# Patient Record
Sex: Female | Born: 1995
Health system: Southern US, Community
[De-identification: ages and names within clinical notes are randomized; demographics above are authoritative.]

## PROBLEM LIST (undated history)

## (undated) DIAGNOSIS — R011 Cardiac murmur, unspecified: Secondary | ICD-10-CM

## (undated) DIAGNOSIS — G40109 Localization-related (focal) (partial) symptomatic epilepsy and epileptic syndromes with simple partial seizures, not intractable, without status epilepticus: Secondary | ICD-10-CM

## (undated) DIAGNOSIS — R569 Unspecified convulsions: Secondary | ICD-10-CM

## (undated) DIAGNOSIS — F32A Depression, unspecified: Secondary | ICD-10-CM

## (undated) DIAGNOSIS — Z8041 Family history of malignant neoplasm of ovary: Secondary | ICD-10-CM

## (undated) DIAGNOSIS — F419 Anxiety disorder, unspecified: Secondary | ICD-10-CM

## (undated) DIAGNOSIS — F4310 Post-traumatic stress disorder, unspecified: Secondary | ICD-10-CM

## (undated) DIAGNOSIS — K219 Gastro-esophageal reflux disease without esophagitis: Secondary | ICD-10-CM

## (undated) DIAGNOSIS — Z803 Family history of malignant neoplasm of breast: Secondary | ICD-10-CM

## (undated) DIAGNOSIS — F1911 Other psychoactive substance abuse, in remission: Secondary | ICD-10-CM

## (undated) DIAGNOSIS — K589 Irritable bowel syndrome without diarrhea: Secondary | ICD-10-CM

## (undated) DIAGNOSIS — D649 Anemia, unspecified: Secondary | ICD-10-CM

## (undated) DIAGNOSIS — F329 Major depressive disorder, single episode, unspecified: Secondary | ICD-10-CM

## (undated) DIAGNOSIS — F319 Bipolar disorder, unspecified: Secondary | ICD-10-CM

## (undated) DIAGNOSIS — F410 Panic disorder [episodic paroxysmal anxiety] without agoraphobia: Secondary | ICD-10-CM

## (undated) DIAGNOSIS — N809 Endometriosis, unspecified: Secondary | ICD-10-CM

## (undated) DIAGNOSIS — F603 Borderline personality disorder: Secondary | ICD-10-CM

## (undated) DIAGNOSIS — J302 Other seasonal allergic rhinitis: Secondary | ICD-10-CM

## (undated) HISTORY — DX: Bipolar disorder, unspecified: F31.9

## (undated) HISTORY — DX: Borderline personality disorder: F60.3

## (undated) HISTORY — PX: LAPAROSCOPIC ABDOMINAL EXPLORATION: SHX6249

## (undated) HISTORY — DX: Unspecified convulsions: R56.9

## (undated) HISTORY — PX: TUBAL LIGATION: SHX77

## (undated) HISTORY — DX: Post-traumatic stress disorder, unspecified: F43.10

## (undated) HISTORY — DX: Family history of malignant neoplasm of ovary: Z80.41

## (undated) HISTORY — DX: Panic disorder (episodic paroxysmal anxiety): F41.0

## (undated) HISTORY — PX: WISDOM TOOTH EXTRACTION: SHX21

## (undated) HISTORY — PX: KNEE SURGERY: SHX244

## (undated) HISTORY — DX: Family history of malignant neoplasm of breast: Z80.3

## (undated) HISTORY — PX: LIVER BIOPSY: SHX301

## (undated) HISTORY — DX: Other psychoactive substance abuse, in remission: F19.11

## (undated) HISTORY — PX: EYE SURGERY: SHX253

## (undated) HISTORY — DX: Cardiac murmur, unspecified: R01.1

## (undated) HISTORY — DX: Gastro-esophageal reflux disease without esophagitis: K21.9

## (undated) HISTORY — PX: ABDOMINAL HYSTERECTOMY: SHX81

## (undated) HISTORY — PX: OVARIAN CYST REMOVAL: SHX89

## (undated) HISTORY — PX: TONSILLECTOMY: SUR1361

## (undated) HISTORY — DX: Other seasonal allergic rhinitis: J30.2

---

## 2015-09-27 ENCOUNTER — Emergency Department (HOSPITAL_COMMUNITY)
Admission: EM | Admit: 2015-09-27 | Discharge: 2015-09-27 | Disposition: A | Discharge status: Home / Self Care | Payer: Medicaid Other | Attending: Emergency Medicine | Admitting: Emergency Medicine

## 2015-09-27 ENCOUNTER — Emergency Department (HOSPITAL_COMMUNITY): Admission: EM | Admit: 2015-09-27 | Discharge: 2015-09-27 | Discharge status: Absent without leave | Payer: Self-pay

## 2015-09-27 ENCOUNTER — Encounter (HOSPITAL_COMMUNITY): Payer: Self-pay

## 2015-09-27 DIAGNOSIS — R05 Cough: Secondary | ICD-10-CM | POA: Diagnosis not present

## 2015-09-27 DIAGNOSIS — Z880 Allergy status to penicillin: Secondary | ICD-10-CM | POA: Diagnosis not present

## 2015-09-27 DIAGNOSIS — Z87891 Personal history of nicotine dependence: Secondary | ICD-10-CM | POA: Diagnosis not present

## 2015-09-27 DIAGNOSIS — R0981 Nasal congestion: Secondary | ICD-10-CM | POA: Diagnosis present

## 2015-09-27 DIAGNOSIS — Z9889 Other specified postprocedural states: Secondary | ICD-10-CM | POA: Diagnosis not present

## 2015-09-27 DIAGNOSIS — J0100 Acute maxillary sinusitis, unspecified: Secondary | ICD-10-CM | POA: Diagnosis not present

## 2015-09-27 MED ORDER — FLUTICASONE PROPIONATE 50 MCG/ACT NA SUSP: 2.0000 | Freq: Every day | NASAL | Status: DC

## 2015-09-27 MED ORDER — AZITHROMYCIN 250 MG PO TABS: 250.0000 mg | ORAL_TABLET | Freq: Every day | ORAL | Status: DC

## 2015-09-27 NOTE — ED Notes (Signed)
Patient states she has sinus congestion x 2 weeks. Patient states she has been taking Benadryl, Mucinex, Tylenol sinus and cold, and Nyquil with no relief.

## 2015-09-27 NOTE — Discharge Instructions (Signed)
1. Medications: flonase, mucinex, azithromycin, usual home medications 2. Treatment: rest, drink plenty of fluids, take tylenol or ibuprofen for fever control 3. Follow Up: Please followup with your primary doctor in 3 days for discussion of your diagnoses and further evaluation after today's visit; if you do not have a primary care doctor use the resource guide provided to find one; Return to the ER for high fevers, difficulty breathing or other concerning symptoms    Sinus Rinse WHAT IS A SINUS RINSE? A sinus rinse is a simple home treatment that is used to rinse your sinuses with a sterile mixture of salt and water (saline solution). Sinuses are air-filled spaces in your skull behind the bones of your face and forehead that open into your nasal cavity. You will use the following:  Saline solution.  Neti pot or spray bottle. This releases the saline solution into your nose and through your sinuses. Neti pots and spray bottles can be purchased at [employer], a health food store, or online. WHEN WOULD I DO A SINUS RINSE? A sinus rinse can help to clear mucus, dirt, dust, or pollen from the nasal cavity. You may do a sinus rinse when you have a cold, a virus, nasal allergy symptoms, a sinus infection, or stuffiness in the nose or sinuses. If you are considering a sinus rinse:  Ask your child's health care provider before performing a sinus rinse on your child.  Do not do a sinus rinse if you have had ear or nasal surgery, ear infection, or blocked ears. HOW DO I DO A SINUS RINSE?  Wash your hands.  Disinfect your device according to the directions provided and then dry it.  Use the solution that comes with your device or one that is sold separately in stores. Follow the mixing directions on the package.  Fill your device with the amount of saline solution as directed by the device instructions.  Stand over a sink and tilt your head sideways over the sink.  Place the spout of  the device in your upper nostril (the one closer to the ceiling).  Gently pour or squeeze the saline solution into the nasal cavity. The liquid should drain to the lower nostril if you are not overly congested.  Gently blow your nose. Blowing too hard may cause ear pain.  Repeat in the other nostril.  Clean and rinse your device with clean water and then air-dry it. ARE THERE RISKS OF A SINUS RINSE?  Sinus rinse is generally very safe and effective. However, there are a few risks, which include:   A burning sensation in the sinuses. This may happen if you do not make the saline solution as directed. Make sure to follow all directions when making the saline solution.  Infection from contaminated water. This is rare, but possible.  Nasal irritation.   This information is not intended to replace advice given to you by your health care provider. Make sure you discuss any questions you have with your health care provider.   Document Released: 04/02/2014 Document Reviewed: 04/02/2014 Elsevier Interactive Patient Education 2016 [employer].   Sinusitis, Adult Sinusitis is redness, soreness, and puffiness (inflammation) of the air pockets in the bones of your face (sinuses). The redness, soreness, and puffiness can cause air and mucus to get trapped in your sinuses. This can allow germs to grow and cause an infection.  HOME CARE   Drink enough fluids to keep your pee (urine) clear or pale yellow.  Use  a humidifier in your home.  Run a hot shower to create steam in the bathroom. Sit in the bathroom with the door closed. Breathe in the steam 3-4 times a day.  Put a warm, moist washcloth on your face 3-4 times a day, or as told by your doctor.  Use salt water sprays (saline sprays) to wet the thick fluid in your nose. This can help the sinuses drain.  Only take medicine as told by your doctor. GET HELP RIGHT AWAY IF:   Your pain gets worse.  You have very bad headaches.  You are  sick to your stomach (nauseous).  You throw up (vomit).  You are very sleepy (drowsy) all the time.  Your face is puffy (swollen).  Your vision changes.  You have a stiff neck.  You have trouble breathing. MAKE SURE YOU:   Understand these instructions.  Will watch your condition.  Will get help right away if you are not doing well or get worse.   This information is not intended to replace advice given to you by your health care provider. Make sure you discuss any questions you have with your health care provider.   Document Released: 02/22/2008 Document Revised: 09/26/2014 Document Reviewed: 04/10/2012 Elsevier Interactive Patient Education 2016 [employer]    Emergency Department Resource Guide 1) Find a Doctor and Pay Out of Pocket Although you won't have to find out who is covered by your insurance plan, it is a good idea to ask around and get recommendations. You will then need to call the office and see if the doctor you have chosen will accept you as a new patient and what types of options they offer for patients who are self-pay. Some doctors offer discounts or will set up payment plans for their patients who do not have insurance, but you will need to ask so you aren't surprised when you get to your appointment.  2) Contact Your Local Health Department Not all health departments have doctors that can see patients for sick visits, but many do, so it is worth a call to see if yours does. If you don't know where your local health department is, you can check in your phone book. The CDC also has a tool to help you locate your state's health department, and many state websites also have listings of all of their local health departments.  3) Find a Walk-in Clinic If your illness is not likely to be very severe or complicated, you may want to try a walk in clinic. These are popping up all over the country in pharmacies, drugstores, and shopping centers. They're usually  staffed by nurse practitioners or physician assistants that have been trained to treat common illnesses and complaints. They're usually fairly quick and inexpensive. However, if you have serious medical issues or chronic medical problems, these are probably not your best option.  No Primary Care Doctor: - Call Health Connect at  [phone] - they can help you locate a primary care doctor that  accepts your insurance, provides certain services, etc. - Physician Referral Service- [phone]  Chronic Pain Problems: Organization         Address  Phone   Notes  [facility]  [phone] Patients need to be referred by their primary care doctor.   Medication Assistance: Organization         Address  Phone   Notes  [facility] [address] [place], [place]  [address] [phone] --Must be a resident of [facility] -- Must have NO insurance coverage whatsoever (no Medicaid/ Medicare, etc.) -- The pt. MUST have a primary care doctor that directs their care regularly and follows them in the community   [facility]ist  [phone]   [employer]  [phone]    Agencies that provide inexpensive medical care: Organization         Address  Phone   Notes  [facility]  [phone]   [facility]    [phone]   [facility] [address] [place], [place] [address] [phone]   [facility] [address] [place] [place] [phone]   Planned Parenthood    [phone]   [facility]    [phone]   [facility]  [address], [place] Phone:  [phone], Fax:  [phone] Hours of Operation:  9 am - 6 pm, M-F.  Also accepts Medicaid/Medicare and self-pay.  [facility]  [address], [place] Phone: [phone], Fax: [phone]. Hours of  Operation:  8:30 am - 5:30 pm, M-F.  Also accepts Medicaid and self-pay.  [facility] [place] [address] [place] Phone: [phone]   [facility] [address] Natasha Bence [place], [place] [phone], Ext. [phone] Mondays & Thursdays: 7-9 AM.  First 15 patients are seen on a first come, first serve basis.    Medicaid-accepting [facility] Providers:  Organization         Address  Phone   Notes  [facility] [address], [place] [phone] Also accepts self-pay patients.  [facility] [address] Laurell Josephs [place], [place]  [phone]   [facility] [address], [place] [phone]   [facility] [address] [place] [phone]   Renaye Rakers [address], [place]   [phone] Only accepts [place] Access [place] patients after they have their name applied to their card.   Self-Pay (no insurance) in [facility]:  Organization         Address  Phone   Notes  Sickle Cell Patients, [facility] [address] [place], [place] [phone]   [facility] [address] [place], [place] [phone]   [facility] [place]  [address], [place] [phone]   [facility]/Dr. Osei-Bonsu  [address], [place] or [address], [place] [phone] Phone number for both [place] and [place] locations is the same.  [facility] [address] [place] [phone]   [employer] [address] [place] or [address] [phone]   [facility] [address], [place] [phone], phone; [phone], fax Sees patients 1st and 3rd Saturday of every month.  Must not qualify for public or private insurance (i.e. Medicaid, Medicare,  [place] Health Choice, Veterans' Benefits)  Household income should be no more than 200% of the poverty level The clinic cannot treat you if you are pregnant or think you are pregnant  Sexually transmitted diseases are not treated at the clinic.    Dental Care: Organization  Address  Phone  Notes  [facility] Department of [facility] [address] [place], [place] [phone] Accepts children up to age 10 who are enrolled in [place] or [place] Health Choice; pregnant women with a Medicaid card; and children who have applied for Medicaid or [place] Health Choice, but were declined, whose parents can pay a reduced fee at time of service.  [facility] of [facility]  [address], [place] [phone] Accepts children up to age 33 who are enrolled in [place] or [place] Health Choice; pregnant women with a Medicaid card; and children who have applied for Medicaid or [place] Health Choice, but were declined, whose parents can pay a reduced fee at time of service.  [facility]  [address] [place], [place] [phone] Patients are seen by appointment only. Walk-ins are not accepted. [facility] will see patients 65 years of age and older. Monday - Tuesday (8am-5pm) Most Wednesdays (8:30-5pm) $30 per visit, cash only  [facility] Access [facility]  [address], [facility] [phone] Patients are seen by appointment only. Walk-ins are not accepted. [facility] will see patients 71 years of age and older. One Wednesday Evening (Monthly: Volunteer Based).  $30 per visit, cash only  [employer]  [phone] for adults; Children under age 16, call Graduate Pediatric Dentistry at [phone]. Children aged 40-14, please call [phone] to request a pediatric application.  Dental services are provided in all areas of dental care including fillings, crowns and bridges,  complete and partial dentures, implants, gum treatment, root canals, and extractions. Preventive care is also provided. Treatment is provided to both adults and children. Patients are selected via a lottery and there is often a waiting list.   [facility] [address], [place]  [phone] [email or web address]   [facility] [address] [place], [place] [phone], Ext. [phone] Second and Fourth Thursday of each month, opens at 6:30 AM; Clinic ends at 9 AM.  Patients are seen on a first-come first-served basis, and a limited number are seen during each clinic.   [facility]  [address] Ether Griffins [place], [place] [phone]   Eligibility Requirements You must have lived in [place], [place], or [place] counties for at least the last three months.   You cannot be eligible for state or federal sponsored [place], including [employer], [place], or [employer].   You generally cannot be eligible for healthcare insurance through your employer.    How to apply: Eligibility screenings are held every Tuesday and Wednesday afternoon from 1:00 pm until 4:00 pm. You do not need an appointment for the interview!  [facility] [address] [place], [place] [phone]   [facility] Health Department  [phone]   [facility] Health Department  [phone]   [facility] Health Department  [phone]    Behavioral Health Resources in the Community: Intensive Outpatient Programs Organization         Address  Phone  Notes  [facility] Services [address], [place], [place] [phone]   [facility] Outpatient [address] [place] [address] [place], [place] [phone]   [facility] [address], [place], [place]  [phone]   [facility] [address]  [place], [place] [phone] or [phone]   Substance Abuse Resources Organization  Address  Phone  Notes  [facility]  [phone]   [place]  [phone]   [facility]  [phone]   Chinita Pester  [phone]   Residential & Outpatient Substance Abuse Program  [phone]   Psychological Services Organization         Address  Phone  Notes  [facility] [place]  [place]  [phone]   [place] [address], [place] or [phone]    Mobile Crisis Teams Organization         Address  Phone  Notes  [facility], Mobile Crisis Care Unit  [phone]   [facility]  [address] [place], [place]   [place] [address] [place] [phone]    Self-Help/Support Groups Organization         Address  Phone             Notes  [place]. of [place] - variety of support groups  [place] Call for more information  Narcotics Anonymous (NA), [facility] [address], [employer] [place]  2 meetings at this location   Special educational needs teacher         Address  Phone  Notes  [facility] [place],    [place]  [phone]   [facility]  [address] [place] [id number], [place], [place]   [place], [place] [phone] Admissions: 8am-3pm M-F  Incentives Substance [place] [address],    [place], [place] [phone]   [facility] [address] [place], [place], [place]   [facility] [address]  [place], [place]   [facility] - Intensive Outpatient [address] Kristeen [address], [place], [place]   [facility] [place].,  [place], [place] [phone] or [phone]   [facility] ([facility]) [address] [place], [place] Accepts Medicaid  [facility] [place] [address],  [place] [phone] Substance Abuse/Addiction Treatment   [facility] Organization         Address  Phone  Notes  [facility]  [phone]   Domenic Schwab, PhD [address] Arlis Porta [place], [place]   [phone] or [phone]   [facility] [place], [place] [phone]   [facility] [address] [place], [place] [phone] Insurance/Medicaid/sponsorship through [facility] and Families [address] [place]                                    [place], [place] [phone] [place] [address], [place] [phone]    Dr. Adele Schilder  [phone]   [facility] Dept. 1) [address] [place] 2) [place] 3)  [place] [address], [place] [phone]  [phone]   [place] [phone] or [phone] (After Hours)

## 2015-09-27 NOTE — ED Provider Notes (Signed)
CSN: [id number]     Arrival date & time 09/27/15  1006 History   First MD Initiated Contact with Patient 09/27/15 1108     Chief Complaint  Patient presents with  . sinus congestion      (Consider location/radiation/quality/duration/timing/severity/associated sxs/prior Treatment) The history is provided by the patient and medical records. No language interpreter was used.   Christina Skinner is a 20 y.o. female  with a hx of tonsillectomy presents to the Emergency Department complaining of gradual, persistent, progressively worsening sinus pressure and congestion onset 2 weeks ago.  Patient reports associated frontal headache, sinus congestion, sinus pressure and thick sinus discharge. She has tried NyQuil, Benadryl, mucin, Tylenol Sinus, warm saline without relief. Nothing seems to make it better or worse. She denies epistaxis, fevers, chills, nausea, vomiting, difficulty swallowing, swelling of her throat.    History reviewed. No pertinent past medical history. Past Surgical History  Procedure Laterality Date  . Knee surgery    . Tonsillectomy    . Eye surgery    . Laparoscopic abdominal exploration     Family History  Problem Relation Age of Onset  . Bipolar disorder Mother    Social History  Substance Use Topics  . Smoking status: Former Games developer  . Smokeless tobacco: Never Used  . Alcohol Use: No   OB History    No data available     Review of Systems  Constitutional: Negative for fever, chills, appetite change and fatigue.  HENT: Positive for congestion, postnasal drip, rhinorrhea, sinus pressure and sore throat (minimal). Negative for ear discharge, ear pain and mouth sores.   Eyes: Negative for visual disturbance.  Respiratory: Positive for cough ( minimal). Negative for chest tightness, shortness of breath, wheezing and stridor.   Cardiovascular: Negative for chest pain, palpitations and leg swelling.  Gastrointestinal: Negative for nausea, vomiting, abdominal  pain and diarrhea.  Genitourinary: Negative for dysuria, urgency, frequency and hematuria.  Musculoskeletal: Negative for myalgias, back pain, arthralgias and neck stiffness.  Skin: Negative for rash.  Neurological: Positive for headaches. Negative for syncope, light-headedness and numbness.  Hematological: Negative for adenopathy.  Psychiatric/Behavioral: The patient is not nervous/anxious.   All other systems reviewed and are negative.     Allergies  Amoxicillin; Keflex; Phenergan; and Tylenol with codeine #3  Home Medications   Prior to Admission medications   Medication Sig Start Date End Date Taking? Authorizing Provider  azithromycin (ZITHROMAX) 250 MG tablet Take 1 tablet (250 mg total) by mouth daily. Take first 2 tablets together, then 1 every day until finished. 09/27/15   Wesly Whisenant, PA-C  fluticasone (FLONASE) 50 MCG/ACT nasal spray Place 2 sprays into both nostrils daily. 09/27/15   Tamme Mozingo, PA-C   BP 121/100 mmHg  Pulse 102  Temp(Src) 98.7 F (37.1 C) (Oral)  Resp 18  SpO2 99%  LMP 09/27/2015 Physical Exam  Constitutional: She is oriented to person, place, and time. She appears well-developed and well-nourished. No distress.  HENT:  Head: Normocephalic and atraumatic.  Right Ear: Tympanic membrane, external ear and ear canal normal.  Left Ear: Tympanic membrane, external ear and ear canal normal.  Nose: Mucosal edema and rhinorrhea present. No epistaxis. Right sinus exhibits maxillary sinus tenderness. Right sinus exhibits no frontal sinus tenderness. Left sinus exhibits maxillary sinus tenderness. Left sinus exhibits no frontal sinus tenderness.  Mouth/Throat: Uvula is midline, oropharynx is clear and moist and mucous membranes are normal. Mucous membranes are not pale and not cyanotic. No oropharyngeal exudate, posterior oropharyngeal  edema, posterior oropharyngeal erythema or tonsillar abscesses.  Maxillary sinus tenderness, worse on the left   Eyes: Conjunctivae are normal. Pupils are equal, round, and reactive to light.  Neck: Normal range of motion and full passive range of motion without pain.  Cardiovascular: Normal rate and intact distal pulses.   Pulmonary/Chest: Effort normal and breath sounds normal. No stridor.  Clear and equal breath sounds without focal wheezes, rhonchi, rales  Abdominal: Soft. Bowel sounds are normal. There is no tenderness.  Musculoskeletal: Normal range of motion.  Lymphadenopathy:    She has no cervical adenopathy.  Neurological: She is alert and oriented to person, place, and time.  Skin: Skin is warm and dry. No rash noted. She is not diaphoretic.  Psychiatric: She has a normal mood and affect.  Nursing note and vitals reviewed.   ED Course  Procedures (including critical care time)   MDM   Final diagnoses:  Acute maxillary sinusitis, recurrence not specified   Leafy RoChristian N Pace-Denardo presents with sinus congestion.  Severe symptoms have been present for greater than 10 days with purulent nasal discharge and maxillary sinus pain, worse on the left.  Concern for early acute bacterial rhinosinusitis.  Patient discharged with Augmentin and flonase.  Instructions given for warm saline nasal wash, mucinex and recommendations for follow-up with primary care physician.    BP 121/100 mmHg  Pulse 102  Temp(Src) 98.7 F (37.1 C) (Oral)  Resp 18  SpO2 99%  LMP 09/27/2015   [name], PA-C 09/27/15 1138  Rolland PorterMark James, MD 10/01/15 [phone]

## 2015-12-04 ENCOUNTER — Encounter: Payer: Self-pay | Admitting: Hematology

## 2015-12-04 ENCOUNTER — Telehealth: Payer: Self-pay | Admitting: Hematology

## 2015-12-04 NOTE — Telephone Encounter (Signed)
Sent out Patient Ref Letter to Ref Provider and New Patient Packet to Pt.

## 2015-12-18 ENCOUNTER — Telehealth: Payer: Self-pay | Admitting: Hematology

## 2015-12-18 ENCOUNTER — Encounter: Payer: Self-pay | Admitting: Hematology

## 2015-12-18 ENCOUNTER — Ambulatory Visit (HOSPITAL_BASED_OUTPATIENT_CLINIC_OR_DEPARTMENT_OTHER): Payer: Medicaid Other | Admitting: Hematology

## 2015-12-18 VITALS — BP 125/62 | HR 63 | Temp 98.3°F | Resp 20 | Ht 64.25 in | Wt 136.5 lb

## 2015-12-18 DIAGNOSIS — D500 Iron deficiency anemia secondary to blood loss (chronic): Secondary | ICD-10-CM | POA: Diagnosis not present

## 2015-12-18 DIAGNOSIS — R5383 Other fatigue: Secondary | ICD-10-CM | POA: Insufficient documentation

## 2015-12-18 DIAGNOSIS — F5089 Other specified eating disorder: Secondary | ICD-10-CM | POA: Diagnosis not present

## 2015-12-18 DIAGNOSIS — N920 Excessive and frequent menstruation with regular cycle: Secondary | ICD-10-CM | POA: Diagnosis not present

## 2015-12-18 DIAGNOSIS — F3189 Other bipolar disorder: Secondary | ICD-10-CM | POA: Diagnosis not present

## 2015-12-18 DIAGNOSIS — F5083 Pica in adults: Secondary | ICD-10-CM | POA: Insufficient documentation

## 2015-12-18 NOTE — Telephone Encounter (Signed)
Gave and printed appt sched and avs for pt for may °

## 2015-12-19 NOTE — Progress Notes (Signed)
[name] Kitchen.    HEMATOLOGY/ONCOLOGY CONSULTATION NOTE  Date of Service: .12/18/2015  PCP : Dr Devoria GlassingAshley Gaines MD ,   Christina Skinner   CHIEF COMPLAINTS/PURPOSE OF CONSULTATION:  Iron deficiency and significant fatigue Microcytic anemia. Chronic  HISTORY OF PRESENTING ILLNESS:   Christina Skinner is a wonderful 20 y.o. female who has been referred to us by Dr Floyce StakesGaines for evaluation and management of  iron deficiency with microcytosis and significant fatigue.  Patient notes she has a history of endometriosis previously treated with Lupron, bipolar disorder, borderline personality disorder who had a full-time vaginal delivery of the baby boy 8 months ago.   She notes that she has had significant fatigue that has been worsening recently. Her primary care physician performed a workup that showed that the patient was iron deficient. She notes that she had a baby 8 months ago which was a full-term vaginal delivery. She notes that she has significant pitting L injuries during childbirth and had a lot of bleeding. She notes that she has had pulmonary menorrhagia with 2-3 periods of month lasting 3-4 days since delivery. Had oligomenorrhea prior to that. Notes that she was tried on ferrous sulfate and could not even take 1 tablet day due to significant constipation nausea and vomiting. She notes that she is tried organic iron formulations and those haven't helped to correct her iron deficiency in the past. She notes significant ice craving suggesting pica symptoms. She notes that her fatigue is so significant that she is having difficulties maintaining energy at work and taking care of her child.  Her last laboratory primary care physician showed a hemoglobin of 13.6 with an MCV of 80 ferritin of 17 and iron saturation of 14%. She was also noted to have a definitive vitamin D deficiency with a level of 17. She was referred to us for consideration of IV iron replacement.she is already on vitamin D  replacement.   She notes no issues with increased bleeding tendencies. She has had surgeries in the past without any concerns with excessive bleeding.   notes that her mother and maternal grandmother had some bleeding issues but isn't able to provide additional details . Patient notes that she has a tubal ligation scheduled in about a week and is hoping to get IV iron prior to that.   MEDICAL HISTORY:   #1 history of endometriosis- notes that she had an ex-laparotomy in 2013 which made the diagnosis. Was on Lupron shots.follows with GYN . #2 bipolar disorder/borderline personality disorder. Is currently on Lamictal. Follows with behavioral health. #3 chronic chest wall pain #4 history of insomnia   SURGICAL HISTORY: Past Surgical History  Procedure Laterality Date  . Knee surgery    . Tonsillectomy    . Eye surgery    . Laparoscopic abdominal exploration      SOCIAL HISTORY: Social History   Social History  . Marital Status: Single    Spouse Name: N/A  . Number of Children: N/A  . Years of Education: N/A   Occupational History  . Not on file.   Social History Main Topics  . Smoking status: Former Games developermoker  . Smokeless tobacco: Never Used  . Alcohol Use: No  . Drug Use: No  . Sexual Activity: Not on file   Other Topics Concern  . Not on file   Social History Narrative  Married to her wife and has 1266-month-old son   FAMILY HISTORY:  Family History  Problem Relation Age of Onset  . Bipolar disorder  Mother   Notes her mother and maternal grandmother had some bleeding problem but is unable to provide any additional information .  ALLERGIES:  is allergic to amoxicillin; keflex; phenergan; and tylenol with codeine #3.  MEDICATIONS:  Current Outpatient Prescriptions  Medication Sig Dispense Refill  . HYDROcodone-acetaminophen (NORCO/VICODIN) 5-325 MG tablet Take by mouth.    . hydrOXYzine (VISTARIL) 25 MG capsule Take 25 mg by mouth.    . lamoTRIgine (LAMICTAL) 100  MG tablet Take 100 mg by mouth.    . promethazine (PHENERGAN) 25 MG suppository Place 25 mg rectally.     No current facility-administered medications for this visit.    REVIEW OF SYSTEMS:    10 Point review of Systems was done is negative except as noted above.  PHYSICAL EXAMINATION: ECOG PERFORMANCE STATUS: 1 - Symptomatic but completely ambulatory  . Filed Vitals:   12/18/15 1102  BP: 125/62  Pulse: 63  Temp: 98.3 F (36.8 C)  Resp: 20   Filed Weights   12/18/15 1102  Weight: 136 lb 8 oz (61.916 kg)   .Body mass index is 23.25 kg/(m^2).  GENERAL:alert, in no acute distress and comfortable SKIN: skin color, texture, turgor are normal, no rashes or significant lesions EYES: normal, conjunctiva are pink and non-injected, sclera clear OROPHARYNX:no exudate, no erythema and lips, buccal mucosa, and tongue normal  NECK: supple, no JVD, thyroid normal size, non-tender, without nodularity LYMPH:  no palpable lymphadenopathy in the cervical, axillary or inguinal LUNGS: clear to auscultation with normal respiratory effort HEART: regular rate & rhythm,  no murmurs and no lower extremity edema ABDOMEN: abdomen soft, non-tender, normoactive bowel sounds  Musculoskeletal: no cyanosis of digits and no clubbing  PSYCH: alert & oriented x 3 with fluent speech NEURO: no focal motor/sensory deficits  LABORATORY DATA:  I have reviewed the data as listed   outside labs reviewed .   RADIOGRAPHIC STUDIES: I have personally reviewed the radiological images as listed and agreed with the findings in the report. No results found.  ASSESSMENT & PLAN:   20 year old Caucasian female with   #1 significant iron deficiency with fatigue and pica symptoms. Ferritin 17 iron saturation 14%. Patient is having ongoing blood loss due to menorrhagia. #2 iron deficiency due to recent pregnancy, pregnancy related to blood loss due to perineal injury, menorrhagia after pregnancy. #3 intolerance to  oral iron #4 microcytosis due to iron deficiency with no significant anemia. #5 bipolar disorder/borderline personality disorder with anxiety and insomnia.  Plan -Patient reports that she has tried several oral iron replacement products and has not tolerated them due to significant GI distress. -She was offered treatment with IV Feraheme and after discussing the pros and cons she is keen to proceed with this. - we will schedule her for IV Feraheme 510 mg every weekly 2 doses . Second dose can be after her tubal ligation procedure if needed . -Patient notes that she tolerates Benadryl well and she'll take this prior to her IV iron. -We will repeat iron labs in 2 months to check adequacy of replacement and response to treatment. -She will continue follow-up with GYN to address her menorrhagia. -Continue follow-up with primary care physician   All of the patients questions were answeredto her  apparent satisfaction. The patient knows to call the clinic with any problems, questions or concerns.  I spent 45 minutes counseling the patient face to face. The total time spent in the appointment was 45 minutes and more than 50% was on counseling and  direct patient cares.    Wyvonnia Lora MD MS AAHIVMS [facility] Hematology/Oncology Physician [facility]  (Office):       [phone] (Work cell):  [phone] (Fax):           [phone]

## 2015-12-23 ENCOUNTER — Encounter (HOSPITAL_COMMUNITY): Payer: Self-pay | Admitting: Emergency Medicine

## 2015-12-23 ENCOUNTER — Emergency Department (HOSPITAL_COMMUNITY)
Admission: EM | Admit: 2015-12-23 | Discharge: 2015-12-23 | Disposition: A | Discharge status: Home / Self Care | Payer: No Typology Code available for payment source | Attending: Emergency Medicine | Admitting: Emergency Medicine

## 2015-12-23 ENCOUNTER — Ambulatory Visit (HOSPITAL_BASED_OUTPATIENT_CLINIC_OR_DEPARTMENT_OTHER): Payer: Medicaid Other

## 2015-12-23 VITALS — BP 104/60 | HR 75 | Temp 98.8°F | Resp 15

## 2015-12-23 DIAGNOSIS — Y9389 Activity, other specified: Secondary | ICD-10-CM | POA: Diagnosis not present

## 2015-12-23 DIAGNOSIS — Z79899 Other long term (current) drug therapy: Secondary | ICD-10-CM | POA: Insufficient documentation

## 2015-12-23 DIAGNOSIS — Y92410 Unspecified street and highway as the place of occurrence of the external cause: Secondary | ICD-10-CM | POA: Diagnosis not present

## 2015-12-23 DIAGNOSIS — D500 Iron deficiency anemia secondary to blood loss (chronic): Secondary | ICD-10-CM

## 2015-12-23 DIAGNOSIS — N920 Excessive and frequent menstruation with regular cycle: Secondary | ICD-10-CM

## 2015-12-23 DIAGNOSIS — Z87891 Personal history of nicotine dependence: Secondary | ICD-10-CM | POA: Diagnosis not present

## 2015-12-23 DIAGNOSIS — S3992XA Unspecified injury of lower back, initial encounter: Secondary | ICD-10-CM | POA: Insufficient documentation

## 2015-12-23 DIAGNOSIS — Z880 Allergy status to penicillin: Secondary | ICD-10-CM | POA: Diagnosis not present

## 2015-12-23 DIAGNOSIS — Y998 Other external cause status: Secondary | ICD-10-CM | POA: Insufficient documentation

## 2015-12-23 MED ORDER — CYCLOBENZAPRINE HCL 10 MG PO TABS: 10.0000 mg | ORAL_TABLET | Freq: Two times a day (BID) | ORAL | Status: DC | PRN

## 2015-12-23 MED ORDER — SODIUM CHLORIDE 0.9 % IV SOLN
510.0000 mg | Freq: Once | INTRAVENOUS | Status: AC
  Administered 2015-12-23: 510 mg via INTRAVENOUS
  Filled 2015-12-23: qty 17

## 2015-12-23 MED ORDER — SODIUM CHLORIDE 0.9 % IV SOLN
Freq: Once | INTRAVENOUS | Status: AC
  Administered 2015-12-23: 08:00:00 via INTRAVENOUS
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
## 2015-12-23 NOTE — Patient Instructions (Signed)

## 2015-12-23 NOTE — Progress Notes (Signed)
Note given to patient for work that she was here for Iron Infusion today and may return to work tomorrow.
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
## 2015-12-30 ENCOUNTER — Telehealth: Payer: Self-pay | Admitting: *Deleted

## 2015-12-30 ENCOUNTER — Ambulatory Visit: Payer: Self-pay

## 2015-12-30 NOTE — Telephone Encounter (Signed)
Called pt about missed iron infusion, pt stated she "just found out she was pregnant and has a lot going on"  Stated she will call later to reschedule apt.

## 2016-01-11 ENCOUNTER — Telehealth: Payer: Self-pay | Admitting: *Deleted

## 2016-01-11 NOTE — Telephone Encounter (Signed)
I have called and gave the patient the new date/time for her treatment.

## 2016-01-13 ENCOUNTER — Ambulatory Visit: Payer: Self-pay

## 2016-02-12 ENCOUNTER — Ambulatory Visit: Payer: Self-pay | Admitting: Hematology

## 2016-02-12 ENCOUNTER — Other Ambulatory Visit: Payer: Self-pay

## 2016-03-03 ENCOUNTER — Telehealth: Payer: Self-pay | Admitting: *Deleted

## 2016-03-03 ENCOUNTER — Other Ambulatory Visit: Payer: Self-pay | Admitting: Hematology

## 2016-03-03 DIAGNOSIS — D500 Iron deficiency anemia secondary to blood loss (chronic): Secondary | ICD-10-CM

## 2016-03-03 NOTE — Telephone Encounter (Signed)
Patient called and left message to move appts. All appts are in the past. I have forwarded the message to the desk RN.

## 2016-03-04 ENCOUNTER — Telehealth: Payer: Self-pay | Admitting: Hematology

## 2016-03-04 ENCOUNTER — Other Ambulatory Visit: Payer: Self-pay | Admitting: *Deleted

## 2016-03-04 ENCOUNTER — Telehealth: Payer: Self-pay

## 2016-03-04 NOTE — Telephone Encounter (Signed)
lvm for pt to call to r/s missed appt °

## 2016-03-04 NOTE — Telephone Encounter (Signed)
pt called back to r/s iron that was missed in April...i advised pt that she needed to s.w nurse since that was 2mths ago and iron is usually given in 2weeks....pt did not want to sched f/u

## 2016-03-04 NOTE — Telephone Encounter (Signed)
returned call and lvm for pt to call back. °

## 2016-03-04 NOTE — Telephone Encounter (Signed)
Patient called stating she missed her last infusion appt and would like to reschedule. Patient missed 4/26 appt and 5/26 appt. Will check with Dr. Candise CheKale about new appt date.

## 2016-11-24 ENCOUNTER — Emergency Department (HOSPITAL_COMMUNITY)
Admission: EM | Admit: 2016-11-24 | Discharge: 2016-11-25 | Disposition: A | Discharge status: Home / Self Care | Payer: Medicaid Other | Attending: Emergency Medicine | Admitting: Emergency Medicine

## 2016-11-24 ENCOUNTER — Encounter (HOSPITAL_COMMUNITY): Payer: Self-pay

## 2016-11-24 DIAGNOSIS — Z79899 Other long term (current) drug therapy: Secondary | ICD-10-CM | POA: Insufficient documentation

## 2016-11-24 DIAGNOSIS — Z87891 Personal history of nicotine dependence: Secondary | ICD-10-CM | POA: Diagnosis not present

## 2016-11-24 DIAGNOSIS — L0231 Cutaneous abscess of buttock: Secondary | ICD-10-CM | POA: Diagnosis not present

## 2016-11-24 MED ORDER — LORAZEPAM 2 MG/ML IJ SOLN
1.0000 mg | Freq: Once | INTRAMUSCULAR | Status: AC
  Administered 2016-11-25: 1 mg via INTRAMUSCULAR
  Filled 2016-11-24: qty 1

## 2016-11-24 MED ORDER — LIDOCAINE-EPINEPHRINE (PF) 2 %-1:200000 IJ SOLN
10.0000 mL | Freq: Once | INTRAMUSCULAR | Status: AC
  Administered 2016-11-24: via INTRADERMAL
  Filled 2016-11-24: qty 20

## 2016-11-24 NOTE — ED Notes (Signed)
Pt has an abscess on her left gluteal area for 4 days. Pt went to the doctor 2 days ago and was prescribed bactrim. The abscess has tripled in size since visiting the doctor. Pt is complaining of pain and nausea from the abscess.

## 2016-11-24 NOTE — ED Provider Notes (Signed)
[facility]-EMERGENCY DEPT Provider Note   CSN: [id number] Arrival date & time: 11/24/16  2246  By signing my name below, I, Christina Skinner, attest that this documentation has been prepared under the direction and in the presence of Jasalyn Frysinger, PA-C. Electronically Signed: Alyssa GroveMartin Skinner, ED Scribe. 11/24/16. 11:46 PM.  History   Chief Complaint Chief Complaint  Patient presents with  . Abscess   The history is provided by the patient. No language interpreter was used.   HPI Comments: Christina Skinner is a 21 y.o. female who presents to the Emergency Department complaining of a gradual onset and worsening, constant, moderately painful area of swelling, redness and induration to the left buttocks for 4 days. Pt saw PCP 2 days ago and tried Bactrim and Tylenol with no relief. She has tried warm compresses and warm showers with mild relief. Since seeing her PCP, the area has tripled in size. Self draining after showers. Pt has had x1 prior abscess to the armpit before. Pt reports associated nausea. She denies fever.  History reviewed. No pertinent past medical history.  Patient Active Problem List   Diagnosis Date Noted  . Iron deficiency anemia due to chronic blood loss 12/18/2015  . Fatigue 12/18/2015  . Pica in adults 12/18/2015    Past Surgical History:  Procedure Laterality Date  . EYE SURGERY    . KNEE SURGERY    . LAPAROSCOPIC ABDOMINAL EXPLORATION    . TONSILLECTOMY      OB History    No data available       Home Medications    Prior to Admission medications   Medication Sig Start Date End Date Taking? Authorizing Provider  cyclobenzaprine (FLEXERIL) 10 MG tablet Take 1 tablet (10 mg total) by mouth 2 (two) times daily as needed for muscle spasms. 12/23/15   Marily MemosJason Mesner, MD  HYDROcodone-acetaminophen (NORCO/VICODIN) 5-325 MG tablet Take by mouth.    Historical Provider, MD  hydrOXYzine (VISTARIL) 25 MG capsule Take 25 mg by mouth.    Historical Provider, MD    lamoTRIgine (LAMICTAL) 100 MG tablet Take 100 mg by mouth.    Historical Provider, MD  promethazine (PHENERGAN) 25 MG suppository Place 25 mg rectally. 05/10/14   Historical Provider, MD    Family History Family History  Problem Relation Age of Onset  . Bipolar disorder Mother     Social History Social History  Substance Use Topics  . Smoking status: Former Games developermoker  . Smokeless tobacco: Never Used  . Alcohol use No     Allergies   Amoxicillin; Keflex [cephalexin]; Phenergan [promethazine hcl]; and Tylenol with codeine #3 [acetaminophen-codeine]   Review of Systems Review of Systems  Constitutional: Negative for fever.  Gastrointestinal: Positive for nausea.  Skin: Positive for color change.     Physical Exam Updated Vital Signs BP 120/74 (BP Location: Left Arm)   Pulse 68   Temp 97.8 F (36.6 C) (Oral)   Resp 18   Ht 5\' 3"  (1.6 m)   Wt 147 lb (66.7 kg)   SpO2 99%   BMI 26.04 kg/m   Physical Exam  Constitutional: She is oriented to person, place, and time. She appears well-developed and well-nourished. She is active. No distress.  HENT:  Head: Normocephalic and atraumatic.  Eyes: Conjunctivae are normal.  Cardiovascular: Normal rate.   Pulmonary/Chest: Effort normal. No respiratory distress.  Musculoskeletal: Normal range of motion.  Neurological: She is alert and oriented to person, place, and time.  Skin: Skin is warm and dry.  4x4cm area of erythema, induration, swelling with central scabbing. TTP  Psychiatric: She has a normal mood and affect. Her behavior is normal.  Nursing note and vitals reviewed.   ED Treatments / Results  DIAGNOSTIC STUDIES: Oxygen Saturation is 99% on RA, normal by my interpretation.    COORDINATION OF CARE: 11:48 PM Discussed treatment plan with pt at bedside which includes incision and drainage and pt agreed to plan.  Labs (all labs ordered are listed, but only abnormal results are displayed) Labs Reviewed - No data to  display  EKG  EKG Interpretation None       Radiology No results found.  Procedures Procedures (including critical care time) INCISION AND DRAINAGE Performed by: Deidre Ala PA-S Consent: Verbal consent obtained. Risks and benefits: risks, benefits and alternatives were discussed Type: abscess  Body area: left buttock  Anesthesia: local infiltration  Incision was made with a scalpel.  Local anesthetic: lidocaine 2% w epinephrine  Anesthetic total: 2 ml  Complexity: complex Blunt dissection to break up loculations  Drainage: purulent  Drainage amount: moderate  Packing material: no packing Patient tolerance: Patient tolerated the procedure well with no immediate complications.    Medications Ordered in ED Medications - No data to display   Initial Impression / Assessment and Plan / ED Course  I have reviewed the triage vital signs and the nursing notes.  Pertinent labs & imaging results that were available during my care of the patient were reviewed by me and considered in my medical decision making (see chart for details).     Patient with an abscess to the left buttock. Otherwise afebrile, nontoxic-appearing. Abscess incised and drained with moderate purulent drainage. Continue Bactrim at home, follow-up with primary care doctor as needed. Tylenol/ Motrin for pain.  Vitals:   11/24/16 2259  BP: 120/74  Pulse: 68  Resp: 18  Temp: 97.8 F (36.6 C)  TempSrc: Oral  SpO2: 99%  Weight: 66.7 kg  Height: 5\' 3"  (1.6 m)   I personally performed the services described in this documentation, which was scribed in my presence. The recorded information has been reviewed and is accurate.   Final Clinical Impressions(s) / ED Diagnoses   Final diagnoses:  Abscess of buttock, left    New Prescriptions New Prescriptions   No medications on file     Jaynie Crumble, PA-C 11/25/16 0050    Dione Booze, MD 11/25/16 [phone]

## 2016-11-25 NOTE — Discharge Instructions (Signed)
Warm compresses and soaks at home. Change dressing as needed. Ibuprofen or tylenol for pain. Continue bactrim. Follow up with your doctor as needed.

## 2016-11-25 NOTE — ED Notes (Signed)
Provider at bedside

## 2017-08-08 ENCOUNTER — Ambulatory Visit: Payer: Self-pay | Admitting: Family Medicine

## 2017-12-07 ENCOUNTER — Other Ambulatory Visit: Payer: Self-pay

## 2017-12-07 ENCOUNTER — Emergency Department (HOSPITAL_BASED_OUTPATIENT_CLINIC_OR_DEPARTMENT_OTHER)
Admission: EM | Admit: 2017-12-07 | Discharge: 2017-12-07 | Disposition: A | Discharge status: Home / Self Care | Payer: 59 | Attending: Emergency Medicine | Admitting: Emergency Medicine

## 2017-12-07 ENCOUNTER — Emergency Department (HOSPITAL_BASED_OUTPATIENT_CLINIC_OR_DEPARTMENT_OTHER): Payer: 59

## 2017-12-07 ENCOUNTER — Encounter (HOSPITAL_BASED_OUTPATIENT_CLINIC_OR_DEPARTMENT_OTHER): Payer: Self-pay | Admitting: *Deleted

## 2017-12-07 DIAGNOSIS — S76219A Strain of adductor muscle, fascia and tendon of unspecified thigh, initial encounter: Secondary | ICD-10-CM | POA: Insufficient documentation

## 2017-12-07 DIAGNOSIS — Y998 Other external cause status: Secondary | ICD-10-CM | POA: Insufficient documentation

## 2017-12-07 DIAGNOSIS — Z79899 Other long term (current) drug therapy: Secondary | ICD-10-CM | POA: Diagnosis not present

## 2017-12-07 DIAGNOSIS — Y929 Unspecified place or not applicable: Secondary | ICD-10-CM | POA: Insufficient documentation

## 2017-12-07 DIAGNOSIS — Y9389 Activity, other specified: Secondary | ICD-10-CM | POA: Diagnosis not present

## 2017-12-07 DIAGNOSIS — Z87891 Personal history of nicotine dependence: Secondary | ICD-10-CM | POA: Diagnosis not present

## 2017-12-07 DIAGNOSIS — X509XXA Other and unspecified overexertion or strenuous movements or postures, initial encounter: Secondary | ICD-10-CM | POA: Insufficient documentation

## 2017-12-07 DIAGNOSIS — S76802A Unspecified injury of other specified muscles, fascia and tendons at thigh level, left thigh, initial encounter: Secondary | ICD-10-CM | POA: Diagnosis present

## 2017-12-07 HISTORY — DX: Anxiety disorder, unspecified: F41.9

## 2017-12-07 HISTORY — DX: Depression, unspecified: F32.A

## 2017-12-07 HISTORY — DX: Anemia, unspecified: D64.9

## 2017-12-07 HISTORY — DX: Endometriosis, unspecified: N80.9

## 2017-12-07 HISTORY — DX: Major depressive disorder, single episode, unspecified: F32.9

## 2017-12-07 MED ORDER — CYCLOBENZAPRINE HCL 10 MG PO TABS: 10.0000 mg | ORAL_TABLET | Freq: Two times a day (BID) | ORAL | 0 refills | Status: DC | PRN

## 2017-12-07 MED ORDER — KETOROLAC TROMETHAMINE 30 MG/ML IJ SOLN: 15.0000 mg | Freq: Once | INTRAMUSCULAR | Status: DC

## 2017-12-07 MED ORDER — DICLOFENAC SODIUM 1 % TD GEL
2.0000 g | Freq: Four times a day (QID) | TRANSDERMAL | 0 refills | Status: DC
Start: 2017-12-07 — End: 2020-01-03

## 2017-12-07 NOTE — ED Triage Notes (Signed)
Pain in her right groin and hip with radiation down her right leg after bending down yesterday. She has been taking Tylenol and Ibuprofen with no relief.

## 2017-12-07 NOTE — ED Notes (Signed)
Family at bedside. 

## 2017-12-07 NOTE — Discharge Instructions (Signed)
Please read instructions below. Apply ice to your groin/hip for 20 minutes at a time. You can take advil every 6 hours as needed for pain. You can take flexeril every 12 hours for muscle spasm. You can apply voltaren gel to your area of pain (avoiding genital regions) 4 times daily for added pain relief. Schedule an appointment with your primary care provider if symptoms persist. Return to the ER for new or concerning symptoms.

## 2017-12-07 NOTE — ED Provider Notes (Signed)
[facility] EMERGENCY DEPARTMENT Provider Note   CSN: [id number] Arrival date & time: 12/07/17  2033     History   Chief Complaint Chief Complaint  Patient presents with  . Hip Pain    HPI Christina Skinner is a 22 y.o. female presenting to the ED with acute onset of right hip pain began yesterday.  Patient states she was bending forward to pick standing up off the ground, with her legs slightly abducted, and had sudden onset of pain in the right groin.  She states she has had persistent pain since that time, that is worse with any movement in particular positions.  She has been taking ibuprofen and Tylenol without relief.  She denies previous injury to right hip.  No other injuries reported.  The history is provided by the patient.    Past Medical History:  Diagnosis Date  . Anemia   . Anxiety   . Bipolar illness (HCC)   . Depression   . Endometriosis     Patient Active Problem List   Diagnosis Date Noted  . Iron deficiency anemia due to chronic blood loss 12/18/2015  . Fatigue 12/18/2015  . Pica in adults 12/18/2015    Past Surgical History:  Procedure Laterality Date  . EYE SURGERY    . KNEE SURGERY    . LAPAROSCOPIC ABDOMINAL EXPLORATION    . TONSILLECTOMY      OB History   None      Home Medications    Prior to Admission medications   Medication Sig Start Date End Date Taking? Authorizing Provider  lamoTRIgine (LAMICTAL) 25 MG tablet Take 50 mg by mouth daily.   Yes [provider]  sertraline (ZOLOFT) 100 MG tablet Take 100 mg by mouth at bedtime.   Yes [provider]  cyclobenzaprine (FLEXERIL) 10 MG tablet Take 1 tablet (10 mg total) by mouth 2 (two) times daily as needed for muscle spasms. 12/07/17   Robinson, [place] N, PA-C  diclofenac sodium (VOLTAREN) 1 % GEL Apply 2 g topically 4 (four) times daily. 12/07/17   Robinson, [place] N, PA-C    Family History Family History  Problem Relation Age of Onset  . Bipolar  disorder Mother     Social History Social History   Tobacco Use  . Smoking status: Former Games developermoker  . Smokeless tobacco: Never Used  Substance Use Topics  . Alcohol use: No  . Drug use: No     Allergies   Amoxicillin; Keflex [cephalexin]; Tylenol with codeine #3 [acetaminophen-codeine]; and Phenergan [promethazine hcl]   Review of Systems Review of Systems  Musculoskeletal: Positive for arthralgias and myalgias.  Neurological: Negative for numbness.     Physical Exam Updated Vital Signs BP (!) 140/95 (BP Location: Left Arm)   Pulse 72   Temp 98.8 F (37.1 C) (Oral)   Resp 18   Ht 5\' 3"  (1.6 m)   Wt 66.2 kg (146 lb)   SpO2 100%   BMI 25.86 kg/m   Physical Exam  Constitutional: She appears well-developed and well-nourished. No distress.  HENT:  Head: Normocephalic and atraumatic.  Eyes: Conjunctivae are normal.  Cardiovascular: Normal rate.  Pulmonary/Chest: Effort normal.  Musculoskeletal:  Right groin with tenderness.  No significant pain with internal/external rotation of right hip.  Pain with abduction and forward flexion passively.  No midline spinal or paraspinal tenderness.  Pelvis is stable.  Neurological:  5/5 strength bilateral lower extremities with dorsi/plantar flexion.  Intact distal pulses and sensation.  Psychiatric:  She has a normal mood and affect. Her behavior is normal.  Nursing note and vitals reviewed.    ED Treatments / Results  Labs (all labs ordered are listed, but only abnormal results are displayed) Labs Reviewed - No data to display  EKG  EKG Interpretation None       Radiology Dg Hip Unilat With Pelvis 2-3 Views Right  Result Date: 12/07/2017 CLINICAL DATA:  Right groin and hip pain radiating down right leg after bending down yesterday. EXAM: DG HIP (WITH OR WITHOUT PELVIS) 2-3V RIGHT COMPARISON:  None. FINDINGS: There is no evidence of hip fracture or dislocation. There is no evidence of arthropathy or other focal bone  abnormality. IMPRESSION: Negative. Electronically Signed   By: Tollie Eth M.D.   On: 12/07/2017 22:33    Procedures Procedures (including critical care time)  Medications Ordered in ED Medications - No data to display   Initial Impression / Assessment and Plan / ED Course  I have reviewed the triage vital signs and the nursing notes.  Pertinent labs & imaging results that were available during my care of the patient were reviewed by me and considered in my medical decision making (see chart for details).     Patient with acute onset of right groin pain after bending forward yesterday.  Suspect muscle strain.  Pelvis is stable.  X-ray is negative for acute pathology.  Neurovascularly intact.  Will discharge with muscle relaxer, and topical Voltaren gel.  Instructions to follow-up with PCP if symptoms persist.  Safe for discharge.  Discussed results, findings, treatment and follow up. Patient advised of return precautions. Patient verbalized understanding and agreed with plan.   Final Clinical Impressions(s) / ED Diagnoses   Final diagnoses:  Groin strain, initial encounter    ED Discharge Orders        Ordered    diclofenac sodium (VOLTAREN) 1 % GEL  4 times daily     12/07/17 2244    cyclobenzaprine (FLEXERIL) 10 MG tablet  2 times daily PRN     12/07/17 2245       Robinson, [place] N, PA-C 12/07/17 2249    Tegeler, Canary Brim, MD 12/08/17 [phone]

## 2018-06-16 ENCOUNTER — Encounter (HOSPITAL_BASED_OUTPATIENT_CLINIC_OR_DEPARTMENT_OTHER): Payer: Self-pay | Admitting: Emergency Medicine

## 2018-06-16 ENCOUNTER — Emergency Department (HOSPITAL_BASED_OUTPATIENT_CLINIC_OR_DEPARTMENT_OTHER)
Admission: EM | Admit: 2018-06-16 | Discharge: 2018-06-16 | Disposition: A | Discharge status: Home / Self Care | Payer: Medicaid Other | Attending: Emergency Medicine | Admitting: Emergency Medicine

## 2018-06-16 ENCOUNTER — Other Ambulatory Visit: Payer: Self-pay

## 2018-06-16 ENCOUNTER — Emergency Department (HOSPITAL_BASED_OUTPATIENT_CLINIC_OR_DEPARTMENT_OTHER): Payer: Medicaid Other

## 2018-06-16 DIAGNOSIS — J069 Acute upper respiratory infection, unspecified: Secondary | ICD-10-CM | POA: Insufficient documentation

## 2018-06-16 DIAGNOSIS — Z79899 Other long term (current) drug therapy: Secondary | ICD-10-CM | POA: Insufficient documentation

## 2018-06-16 DIAGNOSIS — R51 Headache: Secondary | ICD-10-CM | POA: Diagnosis present

## 2018-06-16 DIAGNOSIS — M542 Cervicalgia: Secondary | ICD-10-CM | POA: Insufficient documentation

## 2018-06-16 DIAGNOSIS — R519 Headache, unspecified: Secondary | ICD-10-CM

## 2018-06-16 DIAGNOSIS — Z87891 Personal history of nicotine dependence: Secondary | ICD-10-CM | POA: Diagnosis not present

## 2018-06-16 LAB — GROUP A STREP BY PCR: Group A Strep by PCR: NOT DETECTED

## 2018-06-16 LAB — PREGNANCY, URINE: Preg Test, Ur: NEGATIVE

## 2018-06-16 MED ORDER — DIPHENHYDRAMINE HCL 50 MG/ML IJ SOLN
12.5000 mg | Freq: Once | INTRAMUSCULAR | Status: AC
  Administered 2018-06-16: 12.5 mg via INTRAVENOUS
  Filled 2018-06-16: qty 1

## 2018-06-16 MED ORDER — KETOROLAC TROMETHAMINE 15 MG/ML IJ SOLN
15.0000 mg | Freq: Once | INTRAMUSCULAR | Status: AC
  Administered 2018-06-16: 15 mg via INTRAVENOUS
  Filled 2018-06-16: qty 1

## 2018-06-16 MED ORDER — SODIUM CHLORIDE 0.9 % IV BOLUS
1000.0000 mL | Freq: Once | INTRAVENOUS | Status: AC
  Administered 2018-06-16: 1000 mL via INTRAVENOUS

## 2018-06-16 MED ORDER — METOCLOPRAMIDE HCL 5 MG/ML IJ SOLN
10.0000 mg | Freq: Once | INTRAMUSCULAR | Status: AC
  Administered 2018-06-16: 10 mg via INTRAVENOUS
  Filled 2018-06-16: qty 2

## 2018-06-16 NOTE — Discharge Instructions (Addendum)
Please return to the Emergency Department for any new or worsening symptoms or if your symptoms do not improve. Please be sure to follow up with your Primary Care Physician as soon as possible regarding your visit today. If you do not have a Primary Doctor please use the resources below to establish one. Please drink plenty of water and get plenty of rest over the next few days to help with your upper respiratory tract infection.  Please follow-up with your primary care provider as soon as possible. Your head CT was negative for acute findings today.  As discussed earlier a lumbar puncture is the only way to evaluate for meningitis.  You have refused lumbar puncture today.  Please return to the emergency department as soon as possible if your headache/neck pain returns or if you have any other concerning symptoms including fever, nausea/vomiting, visual changes, confusion or any other abnormal signs/symptoms.  Contact a health care provider if: Your symptoms are not helped by medicine. You have a headache that is different from the usual headache. You have nausea or you vomit. You have a fever. Get help right away if: Your headache becomes severe. You have repeated vomiting. You have a stiff neck. You have a loss of vision. You have problems with speech. You have pain in the eye or ear. You have muscular weakness or loss of muscle control. You lose your balance or have trouble walking. You feel faint or pass out. You have confusion. Contact a health care provider if: You are getting worse rather than better. Your symptoms are not controlled by medicine. You have chills. You have worsening shortness of breath. You have brown or red mucus. You have yellow or brown nasal discharge. You have pain in your face, especially when you bend forward. You have a fever. You have swollen neck glands. You have pain while swallowing. You have white areas in the back of your throat. Get help right  away if: You have severe or persistent: Headache. Ear pain. Sinus pain. Chest pain. You have chronic lung disease and any of the following: Wheezing. Prolonged cough. Coughing up blood. A change in your usual mucus. You have a stiff neck. You have changes in your: Vision. Hearing. Thinking. Mood.  Do not take your medicine if  develop an itchy rash, swelling in your mouth or lips, or difficulty breathing.   RESOURCE GUIDE  Chronic Pain Problems: Contact [facility]  [phone] Patients need to be referred by their primary care doctor.  Insufficient Money for Medicine: Contact United Way:  call "211" or [facility] [phone].  No Primary Care Doctor: Call [facility]  [phone] - can help you locate a primary care doctor that  accepts your insurance, provides certain services, etc. Physician Referral [phone]  Agencies that provide inexpensive medical care: [facility]  [phone] [facility] Internal Medicine  [phone] [facility]  [phone] [facility]  [phone] Planned Parenthood  [phone] [facility] Child Clinic  [phone]  Medicaid-accepting [facility] Providers: [facility]- [address]  [phone], Mon-Fri 9am-7pm, Sat 9am-1pm [facility]- [address] [place], Suite [place]  [phone] [facility]- [address], [address] [place]  [phone] [facility]- [address]  [phone] Renaye Rakers- [address] [place], [address], [phone]  Only accepts [place] patients after they have their name  applied to their card  Self Pay (no insurance) in [facility]: Sickle Cell Patients: Dr Willey Blade, [facility]  [address] [place], [phone] [facility]- [address] [place]  [phone]       Patrcia Dolly [facility] Urgent Care [place]- [address]       -     [facility]- see information above (Speak to [employer] if you do not have insurance)       -  [facility]- [address] [place], [phone]       -  [facility] [place]- [address],  [phone]       -  [facility]- [address], [phone]       -  Dr Julio Sicks-  [address], [place], [phone]       -  [facility]- [address], [phone]       -  [facility]- [address] [phone], also [address] [phone]       -    [facility]- [address] [place], [phone], 1st & 3rd Saturday   every month, 10am-1pm  1) Find a Doctor and Pay Out of Pocket Although you won't have to find out who is covered by your insurance plan, it is a good idea to ask around and get recommendations. You will then need to call the office and see if the doctor you have chosen will accept you as a new patient and what types of options they offer for patients who are self-pay. Some doctors offer discounts or will set up payment plans for their patients who do not have insurance, but you will need to ask so you aren't surprised when you get to your appointment.  2) Contact Your Local Health Department Not all health departments have doctors that can see patients for sick visits, but many do, so it is worth a call to see if yours does. If you don't know where your local health department is, you can check in your phone book. The CDC also has a tool to help you locate your state's health department, and many state websites also have listings of all of their local health departments.  3) Find a Walk-in Clinic If your illness is not likely to be very severe or complicated, you may want to try a walk in clinic. These are popping up all over the country in pharmacies, drugstores, and shopping centers. They're usually staffed by nurse practitioners or physician assistants that have been trained to treat common illnesses  and complaints. They're usually fairly quick and inexpensive. However, if you have serious medical issues or chronic medical problems, these are probably not your best option  STD Testing [facility] Department of [place], STD Clinic, [address], [place], phone [phone] or [phone].  Monday - Friday, call for an appointment. [facility] Department of [employer], STD Clinic, [place] [address], [place], phone [phone] or [phone].  Monday - Friday, call for an appointment.  Abuse/Neglect: [facility] Child Abuse Hotline 360-104-7306 [facility] Child Abuse Hotline 781-683-4377 (After Hours)  Emergency Shelter:  [facility] 260-880-6015  Maternity Homes: Room at the [place] [facility] 973-729-0694 [facility] [phone]  MRSA Hotline #:   251-777-4182  [facility]  [employer]  United Way [facility] Dept. [address]                 [address]         [address] [place] [address]  Blondell Reveal Phone:  [phone]                                  Phone:  [phone]                   Phone:  [phone]  [facility], [phone] [facility] [place]- [phone]       -     [facility] in [place], [address]                                  [phone], [facility] Child Abuse Hotline 734-067-0134 or 773-288-4280 (After Hours)   Behavioral Health Services  Substance Abuse Resources: Alcohol and Drug Services  [phone] [facility] [phone] [facility] [phone] Floydene Flock [phone] Residential & Outpatient Substance Abuse Program  [phone]  Psychological Services: [facility]   [phone] [facility] Services  [phone] [facility] [phone] [place]. [address] [place], ACCESS LINE: 540-231-0168 or [phone], [employer]  Dental Assistance  If unable to pay or uninsured, contact:  [facility] or [facility]. to become qualified for the adult dental clinic.  Patients with Medicaid: [facility] [phone] [address] Qu[address], [phone] [address] [phone]  If unable to pay, or uninsured, contact [facility] [phone]) or [facility] Department [phone] in [place], [phone] in [facility]) to become qualified for the adult dental clinic   Other Low-Cost Community Dental Services: [facility]- [address] [place], [place], [place], [address], [phone], Ext. [address], 2nd and 4th Thursday of the month at 6:30am.  10 clients each day by appointment, can sometimes see walk-in patients if someone does not show for an appointment. [facility]- [address] [place], [place], [address], [phone] [facility] [address] [place], [place], [address], [phone] [employer] [facility] Health Department- [phone] [facility] Health Department- [phone] [facility] [phone]

## 2018-06-16 NOTE — ED Notes (Signed)
Pt pushed the call bell and requested her IV be removed because she was leaving the hospital without her results.

## 2018-06-16 NOTE — ED Notes (Signed)
CT on hold because patient is shaking due fluids being introduced; will return after 10-15 minutes per pt's request

## 2018-06-16 NOTE — ED Triage Notes (Signed)
Patient states that she has had sore throat and headache x 2 -3 days  - the patient reports that she also has had sinus drainage for the last 3 weeks

## 2018-06-16 NOTE — ED Notes (Signed)
Patient verbalizes understanding of discharge instructions. Opportunity for questioning and answers were provided. Armband removed by staff, pt discharged from ED to home via POV  

## 2018-06-16 NOTE — ED Provider Notes (Signed)
[facility] EMERGENCY DEPARTMENT Provider Note   CSN: [id number] Arrival date & time: 06/16/18  1733     History   Chief Complaint Chief Complaint  Patient presents with  . Sore Throat  . Headache    HPI Christina Skinner is a 22 y.o. female presenting for sinus pressure, rhinorrhea, congestion, sore throat and headache that has been present for 3 weeks.  Patient states that symptoms have been constant for 3 weeks however worsened yesterday when she became febrile, reports temperature of 100.5.  At this time patient describes her headache as bilateral behind her eyes, describes it as a throbbing constant moderate pain worsened with leaning forward and somewhat relieved with ibuprofen.  Patient also states that she has been feeling soreness in her bilateral shoulders and neck.  Describes the pain as a mild constant ache that is worsened with movement of her shoulders.  Patient denies injury, falls or trauma to the area.  Patient denies numbness/weakness, visual changes, bowel or bladder incontinence, saddle area paresthesias.  Patient states that she has had headaches similar in the past, states that she has a history of migraines. HPI  Past Medical History:  Diagnosis Date  . Anemia   . Anxiety   . Bipolar illness (HCC)   . Depression   . Endometriosis     Patient Active Problem List   Diagnosis Date Noted  . Iron deficiency anemia due to chronic blood loss 12/18/2015  . Fatigue 12/18/2015  . Pica in adults 12/18/2015    Past Surgical History:  Procedure Laterality Date  . EYE SURGERY    . KNEE SURGERY    . LAPAROSCOPIC ABDOMINAL EXPLORATION    . TONSILLECTOMY       OB History   None      Home Medications    Prior to Admission medications   Medication Sig Start Date End Date Taking? Authorizing Provider  gabapentin (NEURONTIN) 300 MG capsule Take 300 mg by mouth 3 (three) times daily.   Yes [provider]  levocetirizine (XYZAL) 5 MG  tablet Take 5 mg by mouth every evening.   Yes [provider]  cyclobenzaprine (FLEXERIL) 10 MG tablet Take 1 tablet (10 mg total) by mouth 2 (two) times daily as needed for muscle spasms. 12/07/17   Robinson, [place] N, PA-C  diclofenac sodium (VOLTAREN) 1 % GEL Apply 2 g topically 4 (four) times daily. 12/07/17   Robinson, [place] N, PA-C  lamoTRIgine (LAMICTAL) 25 MG tablet Take 50 mg by mouth daily.    [provider]  sertraline (ZOLOFT) 100 MG tablet Take 100 mg by mouth at bedtime.    [provider]    Family History Family History  Problem Relation Age of Onset  . Bipolar disorder Mother     Social History Social History   Tobacco Use  . Smoking status: Former Games developer  . Smokeless tobacco: Never Used  Substance Use Topics  . Alcohol use: No  . Drug use: No     Allergies   Amoxicillin; Keflex [cephalexin]; Tylenol with codeine #3 [acetaminophen-codeine]; and Phenergan [promethazine hcl]   Review of Systems Review of Systems  Constitutional: Positive for fever. Negative for chills.  HENT: Positive for congestion and sinus pressure. Negative for rhinorrhea, sore throat and trouble swallowing.   Eyes: Negative.  Negative for pain and visual disturbance.  Respiratory: Negative.  Negative for cough and shortness of breath.   Cardiovascular: Negative.  Negative for chest pain.  Gastrointestinal: Negative.  Negative  for abdominal pain, blood in stool, diarrhea, nausea and vomiting.  Genitourinary: Negative.  Negative for dysuria and hematuria.  Musculoskeletal: Positive for arthralgias. Negative for myalgias.  Skin: Negative.  Negative for rash.  Neurological: Positive for headaches. Negative for dizziness and weakness.    Physical Exam Updated Vital Signs BP 125/84 (BP Location: Left Arm)   Pulse (!) 103   Temp 98.2 F (36.8 C) (Oral)   Resp 18   Ht 5' 3.5" (1.613 m)   Wt 81.6 kg   LMP 09/27/2015   SpO2 99%   BMI 31.39 kg/m   Physical  Exam  Constitutional: She is oriented to person, place, and time. She appears well-developed and well-nourished. She does not appear ill. No distress.  HENT:  Head: Normocephalic and atraumatic. Head is without raccoon's eyes and without Battle's sign.  Right Ear: External ear normal.  Left Ear: External ear normal.  Nose: Nose normal.  Mouth/Throat: Uvula is midline, oropharynx is clear and moist and mucous membranes are normal. Tonsils are 1+ on the right. Tonsils are 1+ on the left. No tonsillar exudate.  Eyes: Pupils are equal, round, and reactive to light. Conjunctivae and EOM are normal.  Neck: Trachea normal, normal range of motion and phonation normal. Neck supple. Muscular tenderness present. No neck rigidity. No tracheal deviation present. No Kernig's sign noted.    Bilateral cervical paraspinal muscular tenderness and trapezius muscular tenderness.  No midline cervical spinal tenderness.  No step-off crepitus or deformity noted.  Cardiovascular: Normal rate, regular rhythm, normal heart sounds and intact distal pulses.  Pulmonary/Chest: Effort normal and breath sounds normal. No respiratory distress. She has no decreased breath sounds.  Abdominal: Soft. Bowel sounds are normal. There is no tenderness. There is no rebound and no guarding.  Musculoskeletal: Normal range of motion.       Cervical back: She exhibits tenderness. She exhibits normal range of motion, no bony tenderness, no swelling, no edema and no deformity.       Thoracic back: Normal.       Lumbar back: Normal.  Neurological: She is alert and oriented to person, place, and time. She has normal strength. No sensory deficit. She displays a negative Romberg sign. Gait normal. GCS eye subscore is 4. GCS verbal subscore is 5. GCS motor subscore is 6.  Mental Status: Alert, oriented, thought content appropriate, able to give a coherent history. Speech fluent without evidence of aphasia. Able to follow 2 step commands without  difficulty. Cranial Nerves: II: Peripheral visual fields grossly normal, pupils equal, round, reactive to light III,IV, VI: ptosis not present, extra-ocular motions intact bilaterally V,VII: smile symmetric, eyebrows raise symmetric, facial light touch sensation equal VIII: hearing grossly normal to voice X: uvula elevates symmetrically XI: bilateral shoulder shrug symmetric and strong XII: midline tongue extension without fassiculations Motor: Normal tone. 5/5 strength in upper and lower extremities bilaterally including strong and equal grip strength and dorsiflexion/plantar flexion Sensory: Sensation intact to light touch in all extremities.Negative Romberg.  Cerebellar: normal finger-to-nose with bilateral upper extremities. Normal heel-to -shin balance bilaterally of the lower extremity. No pronator drift.  Gait: normal gait and balance CV: distal pulses palpable throughout  Skin: Skin is warm and dry. Capillary refill takes less than 2 seconds.  Psychiatric: She has a normal mood and affect. Her behavior is normal.     ED Treatments / Results  Labs (all labs ordered are listed, but only abnormal results are displayed) Labs Reviewed  GROUP A STREP BY PCR  PREGNANCY, URINE    EKG None  Radiology Dg Chest 2 View  Result Date: 06/16/2018 CLINICAL DATA:  URI EXAM: CHEST - 2 VIEW COMPARISON:  None. FINDINGS: The heart size and mediastinal contours are within normal limits. Both lungs are clear. The visualized skeletal structures are unremarkable. IMPRESSION: No active cardiopulmonary disease. Electronically Signed   By: Jasmine Pang M.D.   On: 06/16/2018 20:14   Ct Head Wo Contrast  Result Date: 06/16/2018 CLINICAL DATA:  22 year old female with headache. EXAM: CT HEAD WITHOUT CONTRAST TECHNIQUE: Contiguous axial images were obtained from the base of the skull through the vertex without intravenous contrast. COMPARISON:  None. FINDINGS: Brain: No evidence of acute  infarction, hemorrhage, hydrocephalus, extra-axial collection or mass lesion/mass effect. Vascular: No hyperdense vessel or unexpected calcification. Skull: Normal. Negative for fracture or focal lesion. Sinuses/Orbits: No acute finding. Other: None. IMPRESSION: Normal noncontrast CT of the brain. Electronically Signed   By: Elgie Collard M.D.   On: 06/16/2018 21:54    Procedures Procedures (including critical care time)  Medications Ordered in ED Medications  sodium chloride 0.9 % bolus 1,000 mL (0 mLs Intravenous Stopped 06/16/18 2129)  ketorolac (TORADOL) 15 MG/ML injection 15 mg (15 mg Intravenous Given 06/16/18 2039)  diphenhydrAMINE (BENADRYL) injection 12.5 mg (12.5 mg Intravenous Given 06/16/18 2040)  metoCLOPramide (REGLAN) injection 10 mg (10 mg Intravenous Given 06/16/18 2039)     Initial Impression / Assessment and Plan / ED Course  I have reviewed the triage vital signs and the nursing notes.  Pertinent labs & imaging results that were available during my care of the patient were reviewed by me and considered in my medical decision making (see chart for details).    Patient has called out, requesting to leave the emergency department.  Patient states that her headache and neck/shoulder pain has been completely relieved by medication.  States that she feels well and is in no pain at this time.  Urine pregnancy negative Group A strep negative CT head negative Chest x-ray negative  Patient presenting with URI symptoms for 3 weeks as well as headache.  Chest x-ray negative for acute infiltrates.\ Patient is afebrile, well-appearing and in no acute distress.  States complete relief of headache with migraine cocktail today.  CT head is negative for acute findings.  On re-evaluation patient denies any neurologic symptoms such as visual changes, focal numbness/weakness, balance problems, confusion, or speech difficulty to suggest a life-threatening intracranial process such as  intracranial hemorrhage or mass. The patient has no clotting risk factors thus venous sinus thrombosis is unlikely. No fevers or nuchal rigidity.. Patient is afebrile, non-toxic and well appearing. Reassuring neuro exam, normal gait to the door and back. No cranial deficits, no speech deficits, negative pronator drift, normal/equal strength to all extremities.   Patient is tachycardic, states that she initially had some bilateral shoulder and neck pain upon arrival.  Negative Kernig sign.  Bilateral musculature and trapezius muscles are tender to palpation feel that it is likely muscular tenderness, possible tension headache that patient has been experiencing.  Possibility of meningitis has been discussed with the patient.  Patient informed that a lumbar puncture is necessary to evaluate for meningitis.  Patient has refused a lumbar puncture at this time.  Patient is alert and oriented x3, and has full capacity to make her own medical decisions.  Patient has refused lumbar puncture.  Shared decision making with patient, patient states that she will return to the emergency department for any red flag  symptoms such as return of headache, return of neck pain, fever, nausea/vomiting, visual changes, confusion or any other concerning signs or symptoms.  Patient's family member at bedside states that she can help watch patient.  I feel that the patient is safe for discharge home at this time. PCP follow up strongly encouraged. I have reviewed return precautions including development of fever, nausea/vomiting or neurologic symptoms, vision changes, confusion, lethargy, difficulty speaking/walking, or other new/worsening/concerning symptoms. Patient states understanding of return precautions. Patient is agreeable to discharge. All questions answered.  At this time there does not appear to be any evidence of an acute emergency medical condition and the patient appears stable for discharge with appropriate outpatient  follow up. Diagnosis was discussed with patient who verbalizes understanding of care plan and is agreeable to discharge. I have discussed return precautions with patient and family at bedside who verbalize understanding of return precautions. Patient strongly encouraged to follow-up with their PCP. All questions answered.  Patient's case discussed with Dr. Rush Landmark who agrees with plan to discharge with follow-up.    Note: Portions of this report may have been transcribed using voice recognition software. Every effort was made to ensure accuracy; however, inadvertent computerized transcription errors may still be present.  Final Clinical Impressions(s) / ED Diagnoses   Final diagnoses:  Viral upper respiratory tract infection  Nonintractable headache, unspecified chronicity pattern, unspecified headache type    ED Discharge Orders    None       Elizabeth Palau 06/17/18 0041    Tegeler, Canary Brim, MD 06/17/18 [phone]

## 2018-06-16 NOTE — ED Notes (Addendum)
Pt transported to CT ?

## 2018-06-16 NOTE — ED Notes (Signed)
Pt returned from CT °

## 2018-06-16 NOTE — ED Notes (Addendum)
Pt requesting to speak to the doctor because she wants to leave. Advised the pt her CT results were not back yet but that wasn't a concern for her.

## 2018-09-29 ENCOUNTER — Emergency Department (HOSPITAL_BASED_OUTPATIENT_CLINIC_OR_DEPARTMENT_OTHER)
Admission: EM | Admit: 2018-09-29 | Discharge: 2018-09-29 | Disposition: A | Discharge status: Home / Self Care | Payer: Medicaid Other | Attending: Emergency Medicine | Admitting: Emergency Medicine

## 2018-09-29 ENCOUNTER — Encounter (HOSPITAL_BASED_OUTPATIENT_CLINIC_OR_DEPARTMENT_OTHER): Payer: Self-pay | Admitting: *Deleted

## 2018-09-29 ENCOUNTER — Other Ambulatory Visit: Payer: Self-pay

## 2018-09-29 DIAGNOSIS — Z87891 Personal history of nicotine dependence: Secondary | ICD-10-CM | POA: Diagnosis not present

## 2018-09-29 DIAGNOSIS — K0889 Other specified disorders of teeth and supporting structures: Secondary | ICD-10-CM | POA: Diagnosis not present

## 2018-09-29 DIAGNOSIS — Z79899 Other long term (current) drug therapy: Secondary | ICD-10-CM | POA: Diagnosis not present

## 2018-09-29 MED ORDER — CLINDAMYCIN HCL 150 MG PO CAPS: 300.0000 mg | ORAL_CAPSULE | Freq: Three times a day (TID) | ORAL | 0 refills | Status: AC

## 2018-09-29 NOTE — ED Triage Notes (Signed)
Left upper tooth broke 2 weeks ago. Pain started today. States she finished a course of doxycycline on Thursday this week for URI

## 2018-09-29 NOTE — ED Notes (Addendum)
Pt states she broke left upper tooth in back x 2 weeks ago. She states she has not had any issues until today. She was eating this am when pain intensified. No swelling noted, pt denies fever.  Pt c/o being nauseous r/t pain. Pt states she took hydrocodone at 12pm today with little relief.

## 2018-09-29 NOTE — ED Notes (Signed)
ED Provider at bedside. 

## 2018-09-29 NOTE — ED Provider Notes (Signed)
[facility] EMERGENCY DEPARTMENT Provider Note   CSN: [id number] Arrival date & time: 09/29/18  2014     History   Chief Complaint Chief Complaint  Patient presents with  . Dental Pain    HPI Christina Skinner is a 23 y.o. female.  23 year old female w/PMH below who presents with dental pain.  Patient states that 2 weeks ago she was eating when a piece of her left upper molar broke off.  She thinks that she had a previous filling at the site.  Today she began having pain at this tooth that has been severe and constant.  No associated swelling or drainage.  She has been nauseated because of the pain.  She took hydrocodone around noon today with no relief.  She has an upcoming appointment with her dentist.  The history is provided by the patient.  Dental Pain  Associated symptoms: no drooling, no facial swelling and no fever     Past Medical History:  Diagnosis Date  . Anemia   . Anxiety   . Bipolar illness (HCC)   . Depression   . Endometriosis     Patient Active Problem List   Diagnosis Date Noted  . Iron deficiency anemia due to chronic blood loss 12/18/2015  . Fatigue 12/18/2015  . Pica in adults 12/18/2015    Past Surgical History:  Procedure Laterality Date  . ABDOMINAL HYSTERECTOMY    . EYE SURGERY    . KNEE SURGERY    . LAPAROSCOPIC ABDOMINAL EXPLORATION    . TONSILLECTOMY       OB History   No obstetric history on file.      Home Medications    Prior to Admission medications   Medication Sig Start Date End Date Taking? Authorizing Provider  cyclobenzaprine (FLEXERIL) 10 MG tablet Take 1 tablet (10 mg total) by mouth 2 (two) times daily as needed for muscle spasms. 12/07/17  Yes Robinson, [place] N, PA-C  diclofenac sodium (VOLTAREN) 1 % GEL Apply 2 g topically 4 (four) times daily. 12/07/17  Yes Robinson, [place] N, PA-C  gabapentin (NEURONTIN) 300 MG capsule Take 300 mg by mouth 3 (three) times daily.   Yes [provider]    lamoTRIgine (LAMICTAL) 25 MG tablet Take 50 mg by mouth daily.   Yes [provider]  sertraline (ZOLOFT) 100 MG tablet Take 100 mg by mouth at bedtime.   Yes [provider]  clindamycin (CLEOCIN) 150 MG capsule Take 2 capsules (300 mg total) by mouth 3 (three) times daily for 7 days. 09/29/18 10/06/18  Salvatrice Morandi, Ambrose [place], MD  levocetirizine (XYZAL) 5 MG tablet Take 5 mg by mouth every evening.    [provider]    Family History Family History  Problem Relation Age of Onset  . Bipolar disorder Mother     Social History Social History   Tobacco Use  . Smoking status: Former Games developer  . Smokeless tobacco: Never Used  Substance Use Topics  . Alcohol use: No  . Drug use: No     Allergies   Ketorolac; Amoxicillin; Keflex [cephalexin]; Tylenol with codeine #3 [acetaminophen-codeine]; Phenergan [promethazine hcl]; and Tramadol   Review of Systems Review of Systems  Constitutional: Negative for fever.  HENT: Positive for dental problem. Negative for drooling, facial swelling and trouble swallowing.   Respiratory: Negative for shortness of breath.      Physical Exam Updated Vital Signs BP (!) 128/94 (BP Location: Left Arm)   Pulse 78   Temp 99  F (37.2 C) (Oral)   Resp 18   Ht 5\' 3"  (1.6 m)   Wt 83 kg   LMP 09/27/2015   SpO2 99%   BMI 32.42 kg/m   Physical Exam Vitals signs and nursing note reviewed.  Constitutional:      General: She is not in acute distress.    Appearance: She is well-developed.  HENT:     Head: Normocephalic and atraumatic.     Mouth/Throat:     Mouth: Mucous membranes are moist.     Pharynx: Oropharynx is clear.      Comments: Lateral portion of tooth #16 broken off, no drainage or mucosal swelling Eyes:     Conjunctiva/sclera: Conjunctivae normal.  Neck:     Musculoskeletal: Neck supple.  Skin:    General: Skin is warm and dry.  Neurological:     Mental Status: She is alert and oriented to person, place,  and time.  Psychiatric:        Judgment: Judgment normal.      ED Treatments / Results  Labs (all labs ordered are listed, but only abnormal results are displayed) Labs Reviewed - No data to display  EKG None  Radiology No results found.  Procedures Procedures (including critical care time)  Medications Ordered in ED Medications - No data to display   Initial Impression / Assessment and Plan / ED Course  I have reviewed the triage vital signs and the nursing notes.  Broken molar on exam, no facial swelling. Will cover for periapical abscess w/ clindamycin. Discussed supportive measures including tylenol/motrin and soft diet. Instructed to see dentist ASAP and reviewed return precautions.  Final Clinical Impressions(s) / ED Diagnoses   Final diagnoses:  Tooth pain    ED Discharge Orders         Ordered    clindamycin (CLEOCIN) 150 MG capsule  3 times daily     09/29/18 2049           Beatrix Breece, Ambrose [place], MD 09/29/18 2053

## 2018-10-15 ENCOUNTER — Ambulatory Visit: Payer: Self-pay | Admitting: Family Medicine

## 2019-02-08 ENCOUNTER — Other Ambulatory Visit: Payer: Self-pay

## 2019-02-08 ENCOUNTER — Emergency Department (HOSPITAL_BASED_OUTPATIENT_CLINIC_OR_DEPARTMENT_OTHER): Payer: Medicaid Other

## 2019-02-08 ENCOUNTER — Encounter (HOSPITAL_BASED_OUTPATIENT_CLINIC_OR_DEPARTMENT_OTHER): Payer: Self-pay

## 2019-02-08 ENCOUNTER — Emergency Department (HOSPITAL_BASED_OUTPATIENT_CLINIC_OR_DEPARTMENT_OTHER)
Admission: EM | Admit: 2019-02-08 | Discharge: 2019-02-08 | Disposition: A | Discharge status: Home / Self Care | Payer: Medicaid Other | Attending: Emergency Medicine | Admitting: Emergency Medicine

## 2019-02-08 DIAGNOSIS — Z87891 Personal history of nicotine dependence: Secondary | ICD-10-CM | POA: Diagnosis not present

## 2019-02-08 DIAGNOSIS — Z79899 Other long term (current) drug therapy: Secondary | ICD-10-CM | POA: Insufficient documentation

## 2019-02-08 DIAGNOSIS — R1031 Right lower quadrant pain: Secondary | ICD-10-CM | POA: Insufficient documentation

## 2019-02-08 DIAGNOSIS — R110 Nausea: Secondary | ICD-10-CM | POA: Diagnosis not present

## 2019-02-08 HISTORY — DX: Irritable bowel syndrome, unspecified: K58.9

## 2019-02-08 LAB — COMPREHENSIVE METABOLIC PANEL
ALT: 12 U/L (ref 0–44)
AST: 15 U/L (ref 15–41)
Albumin: 4.1 g/dL (ref 3.5–5.0)
Alkaline Phosphatase: 67 U/L (ref 38–126)
Anion gap: 6 (ref 5–15)
BUN: 7 mg/dL (ref 6–20)
CO2: 24 mmol/L (ref 22–32)
Calcium: 9.3 mg/dL (ref 8.9–10.3)
Chloride: 107 mmol/L (ref 98–111)
Creatinine, Ser: 0.53 mg/dL (ref 0.44–1.00)
GFR calc Af Amer: >60 mL/min (ref >60)
GFR calc non Af Amer: >60 mL/min (ref >60)
Glucose, Bld: 89 mg/dL (ref 70–99)
Potassium: 3.6 mmol/L (ref 3.5–5.1)
Sodium: 137 mmol/L (ref 135–145)
Total Bilirubin: 0.3 mg/dL (ref 0.3–1.2)
Total Protein: 7.3 g/dL (ref 6.5–8.1)

## 2019-02-08 LAB — URINALYSIS, ROUTINE W REFLEX MICROSCOPIC
Bilirubin Urine: NEGATIVE
Glucose, UA: NEGATIVE mg/dL
Hgb urine dipstick: NEGATIVE
Ketones, ur: NEGATIVE mg/dL
Leukocytes,Ua: NEGATIVE
Nitrite: NEGATIVE
Protein, ur: NEGATIVE mg/dL
Specific Gravity, Urine: 1.025 (ref 1.005–1.030)
pH: 6 (ref 5.0–8.0)

## 2019-02-08 LAB — CBC WITH DIFFERENTIAL/PLATELET
Abs Immature Granulocytes: 0.02 10*3/uL (ref 0.00–0.07)
Basophils Absolute: 0.1 10*3/uL (ref 0.0–0.1)
Basophils Relative: 1 %
Eosinophils Absolute: 0.1 10*3/uL (ref 0.0–0.5)
Eosinophils Relative: 2 %
HCT: 39.4 % (ref 36.0–46.0)
Hemoglobin: 12.6 g/dL (ref 12.0–15.0)
Immature Granulocytes: 0 %
Lymphocytes Relative: 43 %
Lymphs Abs: 2.8 10*3/uL (ref 0.7–4.0)
MCH: 26.9 pg (ref 26.0–34.0)
MCHC: 32 g/dL (ref 30.0–36.0)
MCV: 84.2 fL (ref 80.0–100.0)
Monocytes Absolute: 0.5 10*3/uL (ref 0.1–1.0)
Monocytes Relative: 8 %
Neutro Abs: 3 10*3/uL (ref 1.7–7.7)
Neutrophils Relative %: 46 %
Platelets: 225 10*3/uL (ref 150–400)
RBC: 4.68 MIL/uL (ref 3.87–5.11)
RDW: 13.2 % (ref 11.5–15.5)
WBC: 6.6 10*3/uL (ref 4.0–10.5)
nRBC: 0 % (ref 0.0–0.2)

## 2019-02-08 LAB — LIPASE, BLOOD: Lipase: 35 U/L (ref 11–51)

## 2019-02-08 MED ORDER — MORPHINE SULFATE (PF) 4 MG/ML IV SOLN
4.0000 mg | Freq: Once | INTRAVENOUS | Status: AC
  Administered 2019-02-08: 14:00:00 4 mg via INTRAVENOUS
  Filled 2019-02-08: qty 1

## 2019-02-08 MED ORDER — SODIUM CHLORIDE 0.9 % IV BOLUS
1000.0000 mL | Freq: Once | INTRAVENOUS | Status: AC
  Administered 2019-02-08: 14:00:00 1000 mL via INTRAVENOUS

## 2019-02-08 MED ORDER — IOHEXOL 300 MG/ML  SOLN
100.0000 mL | Freq: Once | INTRAMUSCULAR | Status: AC | PRN
  Administered 2019-02-08: 16:00:00 100 mL via INTRAVENOUS

## 2019-02-08 MED ORDER — ONDANSETRON HCL 4 MG/2ML IJ SOLN
4.0000 mg | Freq: Once | INTRAMUSCULAR | Status: AC
  Administered 2019-02-08: 14:00:00 4 mg via INTRAVENOUS
  Filled 2019-02-08: qty 2

## 2019-02-08 NOTE — ED Triage Notes (Signed)
C/o abd pain x today-hx of same and being seen at "[facility]" and was advised to come to ED-NAD-steady gait

## 2019-02-08 NOTE — ED Notes (Signed)
Patient transported to CT 

## 2019-02-08 NOTE — ED Provider Notes (Signed)
[facility] EMERGENCY DEPARTMENT Provider Note   CSN: [id number] Arrival date & time: 02/08/19  1311    History   Chief Complaint Chief Complaint  Patient presents with   Abdominal Pain    HPI Christina Skinner is a 23 y.o. female.     23yo female with complaint of RLQ abdominal pain onset 9AM while cleaning counters, sudden onset of sharp pain. Pain has been aching, constant, radiates to umbilical area. Associated with nausea, took Zofran with improvement. Pain is worse with movement/waling. No improvement with heating pad. Called her GI office today and told to go to the ER.  Denies changes in bowel or bladder habits, vaginal discharge. Patient reports history of ovarian cysts, states this feels completely different, last cyst was 1 month ago, pain is more intense this time, location- cyst pain is more midline and stays in 1 place. Prior abdominal surgeries include tubulization, partial hysterectomy for adenomyosis and heavy bleeding. exploratory surgery for endometriosis.     Past Medical History:  Diagnosis Date   Anemia    Anxiety    Bipolar depression (HCC)    Borderline personality disorder (HCC)    Depression    Endometriosis    Family history of breast cancer    Family history of ovarian cancer    GERD (gastroesophageal reflux disease)    H/O: substance abuse (HCC)    Heart murmur    IBS (irritable bowel syndrome)    Panic attack    PTSD (post-traumatic stress disorder)    Seasonal allergies    Seizures (HCC)     Patient Active Problem List   Diagnosis Date Noted   Iron deficiency anemia due to chronic blood loss 12/18/2015   Fatigue 12/18/2015   Pica in adults 12/18/2015    Past Surgical History:  Procedure Laterality Date   ABDOMINAL HYSTERECTOMY     EYE SURGERY     KNEE SURGERY Bilateral    LAPAROSCOPIC ABDOMINAL EXPLORATION     LIVER BIOPSY     OVARIAN CYST REMOVAL     TONSILLECTOMY     TUBAL LIGATION       WISDOM TOOTH EXTRACTION       OB History   No obstetric history on file.      Home Medications    Prior to Admission medications   Medication Sig Start Date End Date Taking? Authorizing Provider  diclofenac sodium (VOLTAREN) 1 % GEL Apply 2 g topically 4 (four) times daily. 12/07/17   Robinson, [place] N, PA-C  gabapentin (NEURONTIN) 300 MG capsule Take 300 mg by mouth 3 (three) times daily.    [provider]  lamoTRIgine (LAMICTAL) 200 MG tablet Take 1 tablet by mouth daily. 03/08/17   [provider]  levocetirizine (XYZAL) 5 MG tablet Take 5 mg by mouth every evening.    [provider]  sertraline (ZOLOFT) 100 MG tablet Take 2 tablets by mouth daily. 08/08/18   [provider]    Family History Family History  Problem Relation Age of Onset   Bipolar disorder Mother    Breast cancer Mother    Hypertension Mother    Mental illness Mother    Rheum arthritis Mother    Seizures Mother    Diabetes Maternal Grandmother    Heart attack Maternal Grandmother    Heart disease Maternal Grandmother    Hypertension Maternal Grandmother    Hyperlipidemia Maternal Grandmother    Breast cancer Paternal Aunt    Breast cancer Paternal Aunt  Ovarian cancer Maternal Aunt    Breast cancer Maternal Aunt    Diabetes Maternal Aunt     Social History Social History   Tobacco Use   Smoking status: Former Smoker   Smokeless tobacco: Never Used  Substance Use Topics   Alcohol use: No   Drug use: Not Currently     Allergies   Ketorolac; Tape; Amoxicillin; Keflex [cephalexin]; Other; Sprintec 28  [norgestimate-eth estradiol]; Tylenol with codeine #3 [acetaminophen-codeine]; Phenergan [promethazine hcl]; and Tramadol   Review of Systems Review of Systems  Constitutional: Negative for chills, diaphoresis and fever.  Gastrointestinal: Positive for abdominal pain and nausea. Negative for constipation, diarrhea and vomiting.   Genitourinary: Negative for difficulty urinating, dysuria, frequency, urgency, vaginal bleeding and vaginal discharge.  Musculoskeletal: Negative for back pain.  Skin: Negative for rash and wound.  Allergic/Immunologic: Negative for immunocompromised state.  Neurological: Negative for weakness.  Psychiatric/Behavioral: Negative for confusion.  All other systems reviewed and are negative.    Physical Exam Updated Vital Signs BP (!) 129/91 (BP Location: Left Arm)    Pulse 87    Temp 98.5 F (36.9 C) (Oral)    Resp 16    Ht 5\' 3"  (1.6 m)    Wt 77.1 kg    LMP 09/27/2015    SpO2 99%    BMI 30.11 kg/m   Physical Exam Vitals signs and nursing note reviewed.  Constitutional:      General: She is not in acute distress.    Appearance: She is well-developed. She is not diaphoretic.  HENT:     Head: Normocephalic and atraumatic.  Cardiovascular:     Rate and Rhythm: Normal rate and regular rhythm.     Heart sounds: Normal heart sounds.  Pulmonary:     Effort: Pulmonary effort is normal.     Breath sounds: Normal breath sounds.  Abdominal:     Palpations: Abdomen is soft.     Tenderness: There is abdominal tenderness in the right lower quadrant. There is rebound. There is no right CVA tenderness or left CVA tenderness. Positive signs include obturator sign.  Skin:    General: Skin is warm and dry.  Neurological:     Mental Status: She is alert and oriented to person, place, and time.  Psychiatric:        Behavior: Behavior normal.      ED Treatments / Results  Labs (all labs ordered are listed, but only abnormal results are displayed) Labs Reviewed  URINALYSIS, ROUTINE W REFLEX MICROSCOPIC - Abnormal; Notable for the following components:      Result Value   APPearance CLOUDY (*)    All other components within normal limits  CBC WITH DIFFERENTIAL/PLATELET  COMPREHENSIVE METABOLIC PANEL  LIPASE, BLOOD    EKG None  Radiology Ct Abdomen Pelvis W Contrast  Result Date:  02/08/2019 CLINICAL DATA:  Right lower quadrant pain and nausea EXAM: CT ABDOMEN AND PELVIS WITH CONTRAST TECHNIQUE: Multidetector CT imaging of the abdomen and pelvis was performed using the standard protocol following bolus administration of intravenous contrast. CONTRAST:  100mL OMNIPAQUE IOHEXOL 300 MG/ML  SOLN COMPARISON:  None. FINDINGS: Lower chest: There is slight posterior bibasilar atelectatic change. There is no lung base edema or consolidation. Hepatobiliary: No focal liver lesions are demonstrable. Gallbladder wall is not appreciably thickened. There is no biliary duct dilatation. Pancreas: There is no evident pancreatic mass or inflammatory focus. Spleen: No splenic lesions are evident. Adrenals/Urinary Tract: Adrenals bilaterally appear unremarkable. Kidneys bilaterally show no evident  mass or hydronephrosis on either side. There is no appreciable renal or ureteral calculus on either side. Urinary bladder is midline with wall thickness within normal limits. Stomach/Bowel: There is no appreciable bowel wall or mesenteric thickening. There is moderate stool in the colon. There is no appreciable bowel obstruction. Terminal ileum appears unremarkable. There is no evident free air or portal venous air. Vascular/Lymphatic: There is no abdominal aortic aneurysm. No vascular lesions are evident. There is no appreciable adenopathy in the abdomen or pelvis. Reproductive: Uterus is absent. There are follicles in each ovary. There is no pelvic mass evident. Other: Appendix appears normal in size and contour. No appendiceal inflammation evident. There is no abscess or ascites in the abdomen or pelvis. Musculoskeletal: There are no blastic or lytic bone lesions. There is no intramuscular or abdominal wall lesion evident. IMPRESSION: 1. A cause for patient's symptoms has not been established with this study. 2. Appendix appears normal. No evident bowel wall thickening or bowel obstruction. Terminal ileum appears  normal. No abscess in the abdomen or pelvis. 3. No evident renal or ureteral calculus. No hydronephrosis. Urinary bladder wall thickness is within normal limits. 4.  Uterus absent. Electronically Signed   By: Bretta Bang III M.D.   On: 02/08/2019 16:04    Procedures Procedures (including critical care time)  Medications Ordered in ED Medications  ondansetron (ZOFRAN) injection 4 mg (4 mg Intravenous Given 02/08/19 1426)  sodium chloride 0.9 % bolus 1,000 mL ( Intravenous Stopped 02/08/19 1535)  morphine 4 MG/ML injection 4 mg (4 mg Intravenous Given 02/08/19 1427)  iohexol (OMNIPAQUE) 300 MG/ML solution 100 mL (100 mLs Intravenous Contrast Given 02/08/19 1541)     Initial Impression / Assessment and Plan / ED Course  I have reviewed the triage vital signs and the nursing notes.  Pertinent labs & imaging results that were available during my care of the patient were reviewed by me and considered in my medical decision making (see chart for details).  Clinical Course as of Feb 07 1617  Fri Feb 08, 2019  3335 23 year old female presents with complaint of right lower quadrant abdominal pain with nausea.  Street ovarian cyst however states that this feels different.  On exam patient has rebound tenderness right lower quadrant with positive obturator sign.  Lab work is unremarkable including CBC, lipase, CMP and urinalysis.  CT abdomen pelvis with contrast ordered to evaluate for appendicitis, appendix is normal.  Evidence of pelvic mass.  Pain has improved.  Patient to follow-up with PCP or GI, advised return to ER for any worsening symptoms.   [LM]    Clinical Course User Index [LM] Jeannie Fend, PA-C      Final Clinical Impressions(s) / ED Diagnoses   Final diagnoses:  Right lower quadrant abdominal pain    ED Discharge Orders    None       Alden Hipp 02/08/19 1618    Tegeler, Canary Brim, MD 02/09/19 [phone]

## 2019-02-08 NOTE — Discharge Instructions (Addendum)
Follow-up with your primary care provider or gastroenterologist.  Take Motrin or Tylenol as instructed for pain.  Take your Zofran as needed for nausea or vomiting.  Return to ER for fever, worsening pain, vomiting not controlled with your Zofran.

## 2019-02-25 DIAGNOSIS — H5213 Myopia, bilateral: Secondary | ICD-10-CM | POA: Diagnosis not present

## 2019-02-25 DIAGNOSIS — Z0100 Encounter for examination of eyes and vision without abnormal findings: Secondary | ICD-10-CM | POA: Diagnosis not present

## 2019-04-13 ENCOUNTER — Emergency Department (HOSPITAL_BASED_OUTPATIENT_CLINIC_OR_DEPARTMENT_OTHER)
Admission: EM | Admit: 2019-04-13 | Discharge: 2019-04-13 | Disposition: A | Discharge status: Home / Self Care | Payer: Medicaid Other | Attending: Emergency Medicine | Admitting: Emergency Medicine

## 2019-04-13 ENCOUNTER — Encounter (HOSPITAL_BASED_OUTPATIENT_CLINIC_OR_DEPARTMENT_OTHER): Payer: Self-pay | Admitting: *Deleted

## 2019-04-13 ENCOUNTER — Other Ambulatory Visit: Payer: Self-pay

## 2019-04-13 DIAGNOSIS — Z87891 Personal history of nicotine dependence: Secondary | ICD-10-CM | POA: Insufficient documentation

## 2019-04-13 DIAGNOSIS — K029 Dental caries, unspecified: Secondary | ICD-10-CM

## 2019-04-13 DIAGNOSIS — Z79899 Other long term (current) drug therapy: Secondary | ICD-10-CM | POA: Insufficient documentation

## 2019-04-13 DIAGNOSIS — K0889 Other specified disorders of teeth and supporting structures: Secondary | ICD-10-CM | POA: Diagnosis present

## 2019-04-13 MED ORDER — CLINDAMYCIN HCL 300 MG PO CAPS: 300.0000 mg | ORAL_CAPSULE | Freq: Four times a day (QID) | ORAL | 0 refills | Status: DC

## 2019-04-13 MED ORDER — BUPIVACAINE-EPINEPHRINE (PF) 0.5% -1:200000 IJ SOLN
1.8000 mL | Freq: Once | INTRAMUSCULAR | Status: AC
  Administered 2019-04-13: 1.8 mL

## 2019-04-13 MED ORDER — BUPIVACAINE-EPINEPHRINE (PF) 0.5% -1:200000 IJ SOLN
INTRAMUSCULAR | Status: AC
  Filled 2019-04-13: qty 1.8

## 2019-04-13 MED ORDER — CLINDAMYCIN HCL 150 MG PO CAPS
300.0000 mg | ORAL_CAPSULE | Freq: Once | ORAL | Status: AC
  Administered 2019-04-13: 300 mg via ORAL
  Filled 2019-04-13: qty 2

## 2019-04-13 NOTE — ED Triage Notes (Signed)
Pt reports right upper dental pain x 5 days. Pt went to dentist and was given a rx for norco without relief.

## 2019-04-13 NOTE — ED Provider Notes (Signed)
[facility] Provider Note: Lowella DellJ. Lane Marielena Harvell, MD, FACEP  CSN: [id number] MRN: [id number] ARRIVAL: 04/13/19 at 0135 ROOM: [facility]   CHIEF COMPLAINT  Dental Pain   HISTORY OF PRESENT ILLNESS  04/13/19 1:59 AM Christina Skinner is a 23 y.o. female with 5 days of pain associated with her right upper lateral incisor.  She saw a dentist 2 or 3 days ago and was placed on Norco but not an antibiotic because "it was not infected according to the x-ray".  She is scheduled for a filling in that tooth in 30 days.  Despite the Norco and topical analgesics she continues to have 10 out of 10 pain in that tooth, worse with eating or drinking.  She has a subjective sensation of associated swelling.  She states the tooth is known to be carious.   Past Medical History:  Diagnosis Date  . Anemia   . Anxiety   . Bipolar depression (HCC)   . Borderline personality disorder (HCC)   . Depression   . Endometriosis   . Family history of breast cancer   . Family history of ovarian cancer   . GERD (gastroesophageal reflux disease)   . H/O: substance abuse (HCC)   . Heart murmur   . IBS (irritable bowel syndrome)   . Panic attack   . PTSD (post-traumatic stress disorder)   . Seasonal allergies   . Seizures (HCC)     Past Surgical History:  Procedure Laterality Date  . ABDOMINAL HYSTERECTOMY    . EYE SURGERY    . KNEE SURGERY Bilateral   . LAPAROSCOPIC ABDOMINAL EXPLORATION    . LIVER BIOPSY    . OVARIAN CYST REMOVAL    . TONSILLECTOMY    . TUBAL LIGATION    . WISDOM TOOTH EXTRACTION      Family History  Problem Relation Age of Onset  . Bipolar disorder Mother   . Breast cancer Mother   . Hypertension Mother   . Mental illness Mother   . Rheum arthritis Mother   . Seizures Mother   . Diabetes Maternal Grandmother   . Heart attack Maternal Grandmother   . Heart disease Maternal Grandmother   . Hypertension Maternal Grandmother   . Hyperlipidemia Maternal Grandmother   .  Breast cancer Paternal Aunt   . Breast cancer Paternal Aunt   . Ovarian cancer Maternal Aunt   . Breast cancer Maternal Aunt   . Diabetes Maternal Aunt     Social History   Tobacco Use  . Smoking status: Former Games developermoker  . Smokeless tobacco: Never Used  Substance Use Topics  . Alcohol use: No  . Drug use: Not Currently    Prior to Admission medications   Medication Sig Start Date End Date Taking? Authorizing Provider  diclofenac sodium (VOLTAREN) 1 % GEL Apply 2 g topically 4 (four) times daily. 12/07/17   Robinson, [place] N, PA-C  gabapentin (NEURONTIN) 300 MG capsule Take 300 mg by mouth 3 (three) times daily.    [provider]  lamoTRIgine (LAMICTAL) 200 MG tablet Take 1 tablet by mouth daily. 03/08/17   [provider]  levocetirizine (XYZAL) 5 MG tablet Take 5 mg by mouth every evening.    [provider]  sertraline (ZOLOFT) 100 MG tablet Take 2 tablets by mouth daily. 08/08/18   [provider]    Allergies Ketorolac, Tape, Amoxicillin, Keflex [cephalexin], Other, Sprintec 28  [norgestimate-eth estradiol], Tylenol with codeine #3 [acetaminophen-codeine], Phenergan [promethazine hcl], and Tramadol  REVIEW OF SYSTEMS  Negative except as noted here or in the History of Present Illness.   PHYSICAL EXAMINATION  Initial Vital Signs Blood pressure (!) 143/106, pulse 77, temperature 98.5 F (36.9 C), temperature source Oral, resp. rate 18, height 5\' 4"  (1.626 m), weight 77.1 kg, last menstrual period 09/27/2015, SpO2 100 %.  Examination General: Well-developed, well-nourished female in no acute distress; appearance consistent with age of record HENT: normocephalic; atraumatic; carious right upper lateral incisor with tenderness to percussion Eyes: Normal appearance Neck: supple; no lymphadenopathy Heart: regular rate and rhythm Lungs: clear to auscultation bilaterally Abdomen: soft; nondistended; nontendernds present Extremities: No  deformity; full range of motion Neurologic: Awake, alert and oriented; motor function intact in all extremities and symmetric; no facial droop Skin: Warm and dry Psychiatric: Normal mood and affect   RESULTS  Summary of this visit's results, reviewed by myself:   EKG Interpretation  Date/Time:    Ventricular Rate:    PR Interval:    QRS Duration:   QT Interval:    QTC Calculation:   R Axis:     Text Interpretation:        Laboratory Studies: No results found for this or any previous visit (from the past 24 hour(s)). Imaging Studies: No results found.  ED COURSE and MDM  Nursing notes and initial vitals signs, including pulse oximetry, reviewed.  Vitals:   04/13/19 0142  BP: (!) 143/106  Pulse: 77  Resp: 18  Temp: 98.5 F (36.9 C)  TempSrc: Oral  SpO2: 100%  Weight: 77.1 kg  Height: 5\' 4"  (1.626 m)   We will place the patient on clindamycin and have her follow-up with her dentist.  We do not have a dentist on call for referral from the ED.  PROCEDURES   DENTAL BLOCK 1.8 milliliters of 0.5% bupivacaine with epinephrine were injected into the buccal fold adjacent to the right upper lateral incisor. The patient tolerated this well and there were no immediate complications. Adequate analgesia was obtained.   ED DIAGNOSES     ICD-10-CM   1. Pain due to dental caries  K02.9        Christina Streed, MD 04/13/19 [phone]

## 2019-06-05 ENCOUNTER — Telehealth: Payer: Medicaid Other

## 2019-11-15 DIAGNOSIS — Z0100 Encounter for examination of eyes and vision without abnormal findings: Secondary | ICD-10-CM | POA: Diagnosis not present

## 2020-01-03 ENCOUNTER — Emergency Department (HOSPITAL_BASED_OUTPATIENT_CLINIC_OR_DEPARTMENT_OTHER)
Admission: EM | Admit: 2020-01-03 | Discharge: 2020-01-03 | Disposition: A | Discharge status: Home / Self Care | Payer: Medicaid Other | Attending: Emergency Medicine | Admitting: Emergency Medicine

## 2020-01-03 ENCOUNTER — Emergency Department (HOSPITAL_BASED_OUTPATIENT_CLINIC_OR_DEPARTMENT_OTHER): Payer: Medicaid Other

## 2020-01-03 ENCOUNTER — Other Ambulatory Visit: Payer: Self-pay

## 2020-01-03 ENCOUNTER — Encounter (HOSPITAL_BASED_OUTPATIENT_CLINIC_OR_DEPARTMENT_OTHER): Payer: Self-pay | Admitting: Emergency Medicine

## 2020-01-03 DIAGNOSIS — B373 Candidiasis of vulva and vagina: Secondary | ICD-10-CM | POA: Insufficient documentation

## 2020-01-03 DIAGNOSIS — Z87891 Personal history of nicotine dependence: Secondary | ICD-10-CM | POA: Diagnosis not present

## 2020-01-03 DIAGNOSIS — Z79899 Other long term (current) drug therapy: Secondary | ICD-10-CM | POA: Insufficient documentation

## 2020-01-03 DIAGNOSIS — B379 Candidiasis, unspecified: Secondary | ICD-10-CM

## 2020-01-03 DIAGNOSIS — R1031 Right lower quadrant pain: Secondary | ICD-10-CM | POA: Insufficient documentation

## 2020-01-03 DIAGNOSIS — R109 Unspecified abdominal pain: Secondary | ICD-10-CM | POA: Diagnosis present

## 2020-01-03 HISTORY — DX: Localization-related (focal) (partial) symptomatic epilepsy and epileptic syndromes with simple partial seizures, not intractable, without status epilepticus: G40.109

## 2020-01-03 LAB — CBC WITH DIFFERENTIAL/PLATELET
Abs Immature Granulocytes: 0.02 10*3/uL (ref 0.00–0.07)
Basophils Absolute: 0.1 10*3/uL (ref 0.0–0.1)
Basophils Relative: 1 %
Eosinophils Absolute: 0.1 10*3/uL (ref 0.0–0.5)
Eosinophils Relative: 2 %
HCT: 41.8 % (ref 36.0–46.0)
Hemoglobin: 13.7 g/dL (ref 12.0–15.0)
Immature Granulocytes: 0 %
Lymphocytes Relative: 41 %
Lymphs Abs: 2.8 10*3/uL (ref 0.7–4.0)
MCH: 27.7 pg (ref 26.0–34.0)
MCHC: 32.8 g/dL (ref 30.0–36.0)
MCV: 84.4 fL (ref 80.0–100.0)
Monocytes Absolute: 0.6 10*3/uL (ref 0.1–1.0)
Monocytes Relative: 8 %
Neutro Abs: 3.2 10*3/uL (ref 1.7–7.7)
Neutrophils Relative %: 48 %
Platelets: 234 10*3/uL (ref 150–400)
RBC: 4.95 MIL/uL (ref 3.87–5.11)
RDW: 12.8 % (ref 11.5–15.5)
WBC: 6.8 10*3/uL (ref 4.0–10.5)
nRBC: 0 % (ref 0.0–0.2)

## 2020-01-03 LAB — COMPREHENSIVE METABOLIC PANEL
ALT: 17 U/L (ref 0–44)
AST: 22 U/L (ref 15–41)
Albumin: 4.1 g/dL (ref 3.5–5.0)
Alkaline Phosphatase: 65 U/L (ref 38–126)
Anion gap: 7 (ref 5–15)
BUN: 11 mg/dL (ref 6–20)
CO2: 27 mmol/L (ref 22–32)
Calcium: 9.2 mg/dL (ref 8.9–10.3)
Chloride: 104 mmol/L (ref 98–111)
Creatinine, Ser: 0.59 mg/dL (ref 0.44–1.00)
GFR calc Af Amer: >60 mL/min (ref >60)
GFR calc non Af Amer: >60 mL/min (ref >60)
Glucose, Bld: 91 mg/dL (ref 70–99)
Potassium: 4.3 mmol/L (ref 3.5–5.1)
Sodium: 138 mmol/L (ref 135–145)
Total Bilirubin: 0.3 mg/dL (ref 0.3–1.2)
Total Protein: 7.6 g/dL (ref 6.5–8.1)

## 2020-01-03 LAB — WET PREP, GENITAL
Sperm: NONE SEEN
Trich, Wet Prep: NONE SEEN

## 2020-01-03 LAB — URINALYSIS, ROUTINE W REFLEX MICROSCOPIC
Bilirubin Urine: NEGATIVE
Glucose, UA: NEGATIVE mg/dL
Hgb urine dipstick: NEGATIVE
Ketones, ur: NEGATIVE mg/dL
Leukocytes,Ua: NEGATIVE
Nitrite: NEGATIVE
Protein, ur: NEGATIVE mg/dL
Specific Gravity, Urine: 1.025 (ref 1.005–1.030)
pH: 6 (ref 5.0–8.0)

## 2020-01-03 LAB — LIPASE, BLOOD: Lipase: 32 U/L (ref 11–51)

## 2020-01-03 MED ORDER — SODIUM CHLORIDE 0.9 % IV BOLUS
1000.0000 mL | Freq: Once | INTRAVENOUS | Status: AC
  Administered 2020-01-03: 1000 mL via INTRAVENOUS

## 2020-01-03 MED ORDER — MORPHINE SULFATE (PF) 4 MG/ML IV SOLN
4.0000 mg | Freq: Once | INTRAVENOUS | Status: AC
  Administered 2020-01-03: 4 mg via INTRAVENOUS
  Filled 2020-01-03: qty 1

## 2020-01-03 MED ORDER — IOHEXOL 300 MG/ML  SOLN
100.0000 mL | Freq: Once | INTRAMUSCULAR | Status: AC | PRN
  Administered 2020-01-03: 100 mL via INTRAVENOUS

## 2020-01-03 MED ORDER — FLUCONAZOLE 150 MG PO TABS: ORAL_TABLET | ORAL | 0 refills | Status: DC

## 2020-01-03 MED ORDER — ONDANSETRON HCL 4 MG/2ML IJ SOLN
4.0000 mg | Freq: Once | INTRAMUSCULAR | Status: AC
  Administered 2020-01-03: 10:00:00 4 mg via INTRAVENOUS
  Filled 2020-01-03: qty 2

## 2020-01-03 MED ORDER — ONDANSETRON 4 MG PO TBDP: 4.0000 mg | ORAL_TABLET | Freq: Three times a day (TID) | ORAL | 0 refills | Status: DC | PRN

## 2020-01-03 NOTE — Discharge Instructions (Addendum)
Your labwork and imaging were reassuring today. Your lab work and CT scan did not show any signs consistent with appendicitis however they could not properly visualize the appendix. Your ultrasound did not show any signs of abnormalities within your ovaries itself.  You did test positive for both a yeast infection and bacterial vaginosis. I have prescribed medication to treat for yeast at this time given your symptoms do not seem consistent with BV. Please follow up with your PCP regarding this for recheck after clearing up the yeast infection I would recommend taking 600 mg Ibuprofen every 6-8 hours as needed for pain and 1,000 mg Tylenol every 8 hours as needed for pain Return to the ED IMMEDIATELY for any worsening symptoms including worsening abdominal pain, excessive vomiting, diarrhea, fevers > 100.4, or any other concerning symptoms. As stated it may be too early to see an acute appendicitis on the CT scan and you may need a repeat scan if your symptoms worsen in anyway. If no worsening in symptoms you may follow up with your PCP next week for recheck

## 2020-01-03 NOTE — ED Notes (Signed)
ED Provider at bedside. 

## 2020-01-03 NOTE — ED Triage Notes (Signed)
RLQ abd pain since 5:30am. Radiates toward left.  Nausea but no vomiting or diarrhea.  Last normal bm yesterday.

## 2020-01-03 NOTE — ED Notes (Signed)
ED Provider at bedside discussing test results and dispo plan of care. 

## 2020-01-03 NOTE — ED Provider Notes (Signed)
[facility] EMERGENCY DEPARTMENT Provider Note   CSN: [id number] Arrival date & time: 01/03/20  1607     History Chief Complaint  Patient presents with  . Abdominal Pain    Christina Skinner is a 24 y.o. female with PMHx anemia, bipolar disorder, depression, endometriosis s/p hysterectomy, GERD, who presents to the ED today complaining of sudden onset, constant, sharp, RLQ abdominal pain that began this morning around 5:30 AM.  Patient reports the pain woke her up out of her sleep.  She also endorses nausea however no vomiting or diarrhea.  She last had a bowel movement yesterday.  With history of hysterectomy and therefore does not get periods.  Took 400 mg ibuprofen earlier this morning without relief. No other complaints at this time. Denies fevers, chills, vaginal discharge, pelvic pain, or any other associated symptoms.   The history is provided by the patient and a friend.       Past Medical History:  Diagnosis Date  . Anemia   . Anxiety   . Bipolar depression (HCC)   . Borderline personality disorder (HCC)   . Depression   . Endometriosis   . Family history of breast cancer   . Family history of ovarian cancer   . GERD (gastroesophageal reflux disease)   . H/O: substance abuse (HCC)   . Heart murmur   . IBS (irritable bowel syndrome)   . Panic attack   . PTSD (post-traumatic stress disorder)   . Seasonal allergies   . Seizures (HCC)   . Temporal lobe epilepsy [facility])     Patient Active Problem List   Diagnosis Date Noted  . Iron deficiency anemia due to chronic blood loss 12/18/2015  . Fatigue 12/18/2015  . Pica in adults 12/18/2015    Past Surgical History:  Procedure Laterality Date  . ABDOMINAL HYSTERECTOMY    . EYE SURGERY    . KNEE SURGERY Bilateral   . LAPAROSCOPIC ABDOMINAL EXPLORATION    . LIVER BIOPSY    . OVARIAN CYST REMOVAL    . TONSILLECTOMY    . TUBAL LIGATION    . WISDOM TOOTH EXTRACTION       OB History   No obstetric  history on file.     Family History  Problem Relation Age of Onset  . Bipolar disorder Mother   . Breast cancer Mother   . Hypertension Mother   . Mental illness Mother   . Rheum arthritis Mother   . Seizures Mother   . Diabetes Maternal Grandmother   . Heart attack Maternal Grandmother   . Heart disease Maternal Grandmother   . Hypertension Maternal Grandmother   . Hyperlipidemia Maternal Grandmother   . Breast cancer Paternal Aunt   . Breast cancer Paternal Aunt   . Ovarian cancer Maternal Aunt   . Breast cancer Maternal Aunt   . Diabetes Maternal Aunt     Social History   Tobacco Use  . Smoking status: Former Games developer  . Smokeless tobacco: Never Used  Substance Use Topics  . Alcohol use: No  . Drug use: Not Currently    Home Medications Prior to Admission medications   Medication Sig Start Date End Date Taking? Authorizing Provider  triamcinolone ointment (KENALOG) 0.1 % Apply to [facility] ROUGH areas of body twice daily as needed. NEVER to face/groin 06/27/19  Yes [provider]  fluconazole (DIFLUCAN) 150 MG tablet Take 1 tablet by mouth on Day 1. Take the additional tablet on Day 4 if you are still  experiencing symptoms 01/03/20   Tanda Rockers, PA-C  gabapentin (NEURONTIN) 300 MG capsule Take 300 mg by mouth 3 (three) times daily.    [provider]  lamoTRIgine (LAMICTAL) 200 MG tablet Take 1 tablet by mouth daily. 03/08/17   [provider]  levETIRAcetam (KEPPRA) 250 MG tablet Take 250 mg by mouth 2 (two) times daily. 12/18/19   [provider]  methylphenidate (RITALIN) 10 MG tablet Take 10 mg by mouth daily. 12/10/19   [provider]  ondansetron (ZOFRAN ODT) 4 MG disintegrating tablet Take 1 tablet (4 mg total) by mouth every 8 (eight) hours as needed for nausea or vomiting. 01/03/20   Ellyana Crigler, PA-C  RETIN-A 0.025 % cream APPLY A PEA SIZED AMOUNT AS A THIN LAYER TO ENTIRE FACE NIGHTLY 09/09/19   [provider]  sertraline (ZOLOFT) 100 MG tablet Take 2 tablets by mouth daily. 08/08/18   [provider]    Allergies    Ketorolac, Tape, Amoxicillin, Keflex [cephalexin], Other, Sprintec 28  [norgestimate-eth estradiol], Tylenol with codeine #3 [acetaminophen-codeine], Phenergan [promethazine hcl], and Tramadol  Review of Systems   Review of Systems  Constitutional: Negative for chills and fever.  Respiratory: Negative for cough and shortness of breath.   Cardiovascular: Negative for chest pain.  Gastrointestinal: Positive for abdominal pain and nausea. Negative for constipation, diarrhea and vomiting.  Genitourinary: Negative for dysuria, flank pain, frequency, pelvic pain, vaginal bleeding and vaginal discharge.  All other systems reviewed and are negative.   Physical Exam Updated Vital Signs BP (!) 121/92 (BP Location: Right Arm)   Pulse 74   Temp 98.1 F (36.7 C) (Oral)   Resp 18   Ht 5\' 3"  (1.6 m)   Wt 84.6 kg   LMP 09/27/2015   SpO2 99%   BMI 33.05 kg/m   Physical Exam Vitals and nursing note reviewed.  Constitutional:      Appearance: She is not ill-appearing or diaphoretic.  HENT:     Head: Normocephalic and atraumatic.  Eyes:     Conjunctiva/sclera: Conjunctivae normal.  Cardiovascular:     Rate and Rhythm: Normal rate and regular rhythm.     Heart sounds: Normal heart sounds.  Pulmonary:     Effort: Pulmonary effort is normal.     Breath sounds: Normal breath sounds. No wheezing, rhonchi or rales.  Abdominal:     General: Bowel sounds are normal.     Palpations: Abdomen is soft.     Tenderness: There is abdominal tenderness in the right lower quadrant. There is no right CVA tenderness, left CVA tenderness, guarding or rebound. Positive signs include McBurney's sign. Negative signs include Rovsing's sign, psoas sign and obturator sign.  Musculoskeletal:     Cervical back: Neck supple.  Skin:    General: Skin is warm and dry.  Neurological:      Mental Status: She is alert.     ED Results / Procedures / Treatments   Labs (all labs ordered are listed, but only abnormal results are displayed) Labs Reviewed  WET PREP, GENITAL - Abnormal; Notable for the following components:      Result Value   Yeast Wet Prep HPF POC PRESENT (*)    Clue Cells Wet Prep HPF POC PRESENT (*)    WBC, Wet Prep HPF POC MODERATE (*)    All other components within normal limits  URINALYSIS, ROUTINE W REFLEX MICROSCOPIC - Abnormal; Notable for the following components:   APPearance HAZY (*)  All other components within normal limits  LIPASE, BLOOD  COMPREHENSIVE METABOLIC PANEL  CBC WITH DIFFERENTIAL/PLATELET  GC/CHLAMYDIA PROBE AMP ([place]) NOT AT [facility]    EKG None  Radiology CT Abdomen Pelvis W Contrast  Result Date: 01/03/2020 CLINICAL DATA:  24 year old female with history of right lower quadrant abdominal pain since 5 a.m. today. Suspected acute appendicitis. EXAM: CT ABDOMEN AND PELVIS WITH CONTRAST TECHNIQUE: Multidetector CT imaging of the abdomen and pelvis was performed using the standard protocol following bolus administration of intravenous contrast. CONTRAST:  179mL OMNIPAQUE IOHEXOL 300 MG/ML  SOLN COMPARISON:  CT the abdomen and pelvis 02/08/2019. FINDINGS: Lower chest: Unremarkable. Hepatobiliary: No suspicious cystic or solid hepatic lesions. No intra or extrahepatic biliary ductal dilatation. Gallbladder is normal in appearance. Pancreas: No pancreatic mass. No pancreatic ductal dilatation. No pancreatic or peripancreatic fluid collections or inflammatory changes. Spleen: Unremarkable. Adrenals/Urinary Tract: Bilateral kidneys and bilateral adrenal glands are normal in appearance. No hydroureteronephrosis. Urinary bladder is normal in appearance. Stomach/Bowel: Normal appearance of the stomach. No pathologic dilatation of small bowel or colon. The appendix is not confidently identified and may be surgically absent. Regardless, there  are no inflammatory changes noted adjacent to the cecum to suggest the presence of an acute appendicitis at this time. Vascular/Lymphatic: No significant atherosclerotic disease, aneurysm or dissection noted in the abdominal or pelvic vasculature. No lymphadenopathy noted in the abdomen or pelvis. Reproductive: Status post hysterectomy. Ovaries are not confidently identified may be surgically absent or atrophic. Other: No significant volume of ascites.  No pneumoperitoneum. Musculoskeletal: There are no aggressive appearing lytic or blastic lesions noted in the visualized portions of the skeleton. IMPRESSION: 1. No acute findings are noted in the abdomen or pelvis to account for the patient's symptoms. 2. The appendix is not confidently identified, but there are no inflammatory changes adjacent to the cecum to suggest an acute appendicitis at this time. 3. Additional incidental findings, as above. Electronically Signed   By: Vinnie Langton M.D.   On: 01/03/2020 10:45   US PELVIC COMPLETE W TRANSVAGINAL AND TORSION R/O  Result Date: 01/03/2020 CLINICAL DATA:  RIGHT lower quadrant pain for 6 hours EXAM: TRANSABDOMINAL AND TRANSVAGINAL ULTRASOUND OF PELVIS DOPPLER ULTRASOUND OF OVARIES TECHNIQUE: Both transabdominal and transvaginal ultrasound examinations of the pelvis were performed. Transabdominal technique was performed for global imaging of the pelvis including uterus, ovaries, adnexal regions, and pelvic cul-de-sac. It was necessary to proceed with endovaginal exam following the transabdominal exam to visualize the ovaries and adnexa. Color and duplex Doppler ultrasound was utilized to evaluate blood flow to the ovaries. COMPARISON:  CT abdomen and pelvis 01/03/2020 FINDINGS: Uterus Surgically absent Endometrium N/A Right ovary Measurements: 2.7 x 1.9 x 1.9 cm = volume: 5.2 mL. Normal morphology without mass. Blood flow present within RIGHT ovary on color Doppler imaging. Left ovary Measurements: 2.6 x 3.5  x 1.7 cm = volume: 8.4 mL. Small exophytic dominant follicle without additional mass. Blood flow present within LEFT ovary on color Doppler imaging. Pulsed Doppler evaluation of both ovaries demonstrates normal low-resistance arterial and venous waveforms. Other findings No free pelvic fluid.  No adnexal masses. IMPRESSION: Post hysterectomy. Unremarkable ovaries and adnexa. No acute abnormalities. Electronically Signed   By: Lavonia Dana M.D.   On: 01/03/2020 12:00    Procedures Procedures (including critical care time)  Medications Ordered in ED Medications  sodium chloride 0.9 % bolus 1,000 mL ( Intravenous Stopped 01/03/20 1114)  ondansetron (ZOFRAN) injection 4 mg (4 mg Intravenous Given 01/03/20  [id number])  morphine 4 MG/ML injection 4 mg (4 mg Intravenous Given 01/03/20 0959)  iohexol (OMNIPAQUE) 300 MG/ML solution 100 mL (100 mLs Intravenous Contrast Given 01/03/20 1008)    ED Course  I have reviewed the triage vital signs and the nursing notes.  Pertinent labs & imaging results that were available during my care of the patient were reviewed by me and considered in my medical decision making (see chart for details).  Clinical Course as of Jan 02 1310  Fri Jan 03, 2020  0930 WBC: 6.8 [MV]  1157 Yeast Wet Prep HPF POC(!): PRESENT [MV]  1157 Clue Cells Wet Prep HPF POC(!): PRESENT [MV]    Clinical Course User Index [MV] Tanda RockersVenter, Clotiel Troop, PA-C   MDM Rules/Calculators/A&P                      24 year old female presents to the ED today complaining of right lower quadrant abdominal/pelvic pain and nausea that started this morning.  Denies fevers, chills, vomiting, diarrhea.  On arrival to the ED patient is afebrile, nontachycardic and nontachypneic.  She appears to be in no acute distress.  She does have some tenderness in the right lower quadrant without peritoneal signs.  Is sexually active with her female partner.  Surgical history includes hysterectomy.  Provide fluids and Zofran and  morphine and work-up with screening labs.  Will obtain CTA at this time to rule out appendicitis and if no acute findings will proceed with pelvic ultrasound to rule out any type of ovarian torsion.   CBC without leukocytosis.  Hemoglobin stable.  CMP without electrolyte abnormalities.  Creatinine 0.59.  No elevation in LFTs.  Lipase 32 UA hazy in appearance with no signs of infection.   CT scan without acute findings. Unable to fully visualize the appendix however no inflammation around the cecum to suggest appendicitis at this time. Given no leukocytosis and no obvious peritoneal signs on exam less concerned regarding this.   Pelvic exam performed with chaperone; small amount of vaginal discharge. Mild R adnexal TTP. No CMT or L adnexal TTP.   Wet prep positive for BV and yeast; exam more consistent with yeast. Had shared decision making with pt; she reports she has frequent yeast infections. Will treat with diflucan today. Have advised she follow up with PCP for reeval if she continues to have vaginal discharge after treatment.   Ultrasound without any acute findings. Very possible that pt may have presented too early for appendicitis vs having some pain from her yeast infection. Will discharge home at this time given she is comfortable appearing. Have advised that she return to the ED IMMEDIATELY for any worsening symptoms including worsening pain, excessive vomiting, diarrhea, fevers > 100.4, chills, passing out. Pt is in understanding of plan; she reports she is ready to go home to pick up her son from school. Will prescribed diflucan and zofran for symptomatic relief. Ibuprofen and Tylenol PRN for pain. If pain improves on its own pt to follow up with PCP next week for reeval but she understands the importance of coming back to the ED if any worsening symptoms. Pt stable for discharge home at this time.   This note was prepared using Dragon voice recognition software and may include  unintentional dictation errors due to the inherent limitations of voice recognition software.  Final Clinical Impression(s) / ED Diagnoses Final diagnoses:  Right lower quadrant abdominal pain  Yeast infection    Rx / DC Orders ED Discharge Orders  Ordered    fluconazole (DIFLUCAN) 150 MG tablet     01/03/20 1252    ondansetron (ZOFRAN ODT) 4 MG disintegrating tablet  Every 8 hours PRN     01/03/20 1252           Discharge Instructions     Your labwork and imaging were reassuring today. Your lab work and CT scan did not show any signs consistent with appendicitis however they could not properly visualize the appendix. Your ultrasound did not show any signs of abnormalities within your ovaries itself.  You did test positive for both a yeast infection and bacterial vaginosis. I have prescribed medication to treat for yeast at this time given your symptoms do not seem consistent with BV. Please follow up with your PCP regarding this for recheck after clearing up the yeast infection I would recommend taking 600 mg Ibuprofen every 6-8 hours as needed for pain and 1,000 mg Tylenol every 8 hours as needed for pain Return to the ED IMMEDIATELY for any worsening symptoms including worsening abdominal pain, excessive vomiting, diarrhea, fevers > 100.4, or any other concerning symptoms. As stated it may be too early to see an acute appendicitis on the CT scan and you may need a repeat scan if your symptoms worsen in anyway. If no worsening in symptoms you may follow up with your PCP next week for recheck       Tanda Rockers, Cordelia Poche 01/03/20 1311    Raeford Razor, MD 01/04/20 1008

## 2020-01-06 LAB — GC/CHLAMYDIA PROBE AMP (~~LOC~~) NOT AT ARMC
Chlamydia: NEGATIVE
Comment: NEGATIVE
Comment: NORMAL
Neisseria Gonorrhea: NEGATIVE

## 2020-02-21 DIAGNOSIS — N6452 Nipple discharge: Secondary | ICD-10-CM | POA: Diagnosis not present

## 2020-03-02 DIAGNOSIS — N6452 Nipple discharge: Secondary | ICD-10-CM | POA: Diagnosis not present

## 2020-03-03 DIAGNOSIS — N6320 Unspecified lump in the left breast, unspecified quadrant: Secondary | ICD-10-CM | POA: Diagnosis not present

## 2020-03-03 DIAGNOSIS — Z6833 Body mass index (BMI) 33.0-33.9, adult: Secondary | ICD-10-CM | POA: Diagnosis not present

## 2020-03-03 DIAGNOSIS — E669 Obesity, unspecified: Secondary | ICD-10-CM | POA: Diagnosis not present

## 2020-03-06 ENCOUNTER — Other Ambulatory Visit: Payer: Self-pay

## 2020-03-06 ENCOUNTER — Ambulatory Visit
Admission: EM | Admit: 2020-03-06 | Discharge: 2020-03-06 | Disposition: A | Discharge status: Home / Self Care | Payer: No Typology Code available for payment source

## 2020-03-06 DIAGNOSIS — S61012A Laceration without foreign body of left thumb without damage to nail, initial encounter: Secondary | ICD-10-CM

## 2020-03-06 NOTE — ED Provider Notes (Signed)
[name]    CSN: [id number] Arrival date & time: 03/06/20  1939      History   Chief Complaint Chief Complaint  Patient presents with  . Laceration    HPI Christina Skinner is a 24 y.o. female presenting for left thumb laceration.  States this occurred a few hours PTA.  Bleeding controlled with direct pressure.  Patient denying numbness or deformity, foreign body.  Was able to clean out at home with hydrogen peroxide.  Last tetanus October 2017.  Denies anticoagulant or blood thinner use.   Past Medical History:  Diagnosis Date  . Anemia   . Anxiety   . Bipolar depression ([place])   . Borderline personality disorder ([place])   . Depression   . Endometriosis   . Family history of breast cancer   . Family history of ovarian cancer   . GERD (gastroesophageal reflux disease)   . H/O: substance abuse ([place])   . Heart murmur   . IBS (irritable bowel syndrome)   . Panic attack   . PTSD (post-traumatic stress disorder)   . Seasonal allergies   . Seizures ([place])   . Temporal lobe epilepsy [facility])     Patient Active Problem List   Diagnosis Date Noted  . Iron deficiency anemia due to chronic blood loss 12/18/2015  . Fatigue 12/18/2015  . Pica in adults 12/18/2015    Past Surgical History:  Procedure Laterality Date  . ABDOMINAL HYSTERECTOMY    . EYE SURGERY    . KNEE SURGERY Bilateral   . LAPAROSCOPIC ABDOMINAL EXPLORATION    . LIVER BIOPSY    . OVARIAN CYST REMOVAL    . TONSILLECTOMY    . TUBAL LIGATION    . WISDOM TOOTH EXTRACTION      OB History   No obstetric history on file.      Home Medications    Prior to Admission medications   Medication Sig Start Date End Date Taking? Authorizing Provider  azelastine (ASTELIN) 0.1 % nasal spray Place into the nose. 01/14/20  Yes [provider]  gabapentin (NEURONTIN) 300 MG capsule Take 300 mg by mouth 3 (three) times daily.   Yes [provider]  lamoTRIgine (LAMICTAL) 200 MG tablet  Take 1 tablet by mouth daily. 03/08/17  Yes [provider]  levETIRAcetam (KEPPRA) 250 MG tablet Take 250 mg by mouth 2 (two) times daily. 12/18/19  Yes [provider]  methylphenidate (RITALIN) 10 MG tablet Take 10 mg by mouth daily. 12/10/19  Yes [provider]  sertraline (ZOLOFT) 100 MG tablet Take 2 tablets by mouth daily. 08/08/18  Yes [provider]  diclofenac (VOLTAREN) 75 MG EC tablet Take 75 mg by mouth 2 (two) times daily. 10/04/19   [provider]  fluconazole (DIFLUCAN) 150 MG tablet Take 1 tablet by mouth on Day 1. Take the additional tablet on Day 4 if you are still experiencing symptoms 01/03/20   Alroy Bailiff, Margaux, PA-C  ondansetron (ZOFRAN ODT) 4 MG disintegrating tablet Take 1 tablet (4 mg total) by mouth every 8 (eight) hours as needed for nausea or vomiting. 01/03/20   Venter, Margaux, PA-C  RETIN-A 0.025 % cream APPLY A PEA SIZED AMOUNT AS A THIN LAYER TO ENTIRE FACE NIGHTLY 09/09/19   [provider]  spironolactone (ALDACTONE) 50 MG tablet Take 50 mg by mouth daily. 12/03/19   [provider]  triamcinolone ointment (KENALOG) 0.1 % Apply to [place] areas of body twice daily as needed. NEVER  to face/groin 06/27/19   [provider]    Family History Family History  Problem Relation Age of Onset  . Bipolar disorder Mother   . Breast cancer Mother   . Hypertension Mother   . Mental illness Mother   . Rheum arthritis Mother   . Seizures Mother   . Diabetes Maternal Grandmother   . Heart attack Maternal Grandmother   . Heart disease Maternal Grandmother   . Hypertension Maternal Grandmother   . Hyperlipidemia Maternal Grandmother   . Breast cancer Paternal Aunt   . Breast cancer Paternal Aunt   . Ovarian cancer Maternal Aunt   . Breast cancer Maternal Aunt   . Diabetes Maternal Aunt     Social History Social History   Tobacco Use  . Smoking status: Former Games developer  . Smokeless tobacco: Never  Used  Vaping Use  . Vaping Use: Never used  Substance Use Topics  . Alcohol use: No  . Drug use: Not Currently     Allergies   Ketorolac, Tape, Amoxicillin, Keflex [cephalexin], Other, Sprintec 28  [norgestimate-eth estradiol], Tylenol with codeine #3 [acetaminophen-codeine], Phenergan [promethazine hcl], and Tramadol   Review of Systems As per HPI   Physical Exam Triage Vital Signs ED Triage Vitals  Enc Vitals Group     BP      Pulse      Resp      Temp      Temp src      SpO2      Weight      Height      Head Circumference      Peak Flow      Pain Score      Pain Loc      Pain Edu?      Excl. in GC?    No data found.  Updated Vital Signs BP 130/86 (BP Location: Left Arm)   Pulse 86   Temp 98.8 F (37.1 C) (Oral)   Resp 18   LMP 09/27/2015   SpO2 97%   Visual Acuity Right Eye Distance:   Left Eye Distance:   Bilateral Distance:    Right Eye Near:   Left Eye Near:    Bilateral Near:     Physical Exam Constitutional:      General: She is not in acute distress. HENT:     Head: Normocephalic and atraumatic.  Eyes:     General: No scleral icterus.    Pupils: Pupils are equal, round, and reactive to light.  Cardiovascular:     Rate and Rhythm: Normal rate.  Pulmonary:     Effort: Pulmonary effort is normal.  Musculoskeletal:        General: Tenderness present. No swelling or deformity. Normal range of motion.  Skin:    Capillary Refill: Capillary refill takes less than 2 seconds.     Coloration: Skin is not jaundiced or pale.     Comments: 1 cm laceration without foreign body noted.  NVI  Neurological:     Mental Status: She is alert and oriented to person, place, and time.      UC Treatments / Results  Labs (all labs ordered are listed, but only abnormal results are displayed) Labs Reviewed - No data to display  EKG   Radiology No results found.  Procedures Laceration Repair  Date/Time: 03/06/2020 8:22 PM Performed by:  Shea Evans, PA-C Authorized by: Shea Evans, PA-C   Consent:    Consent obtained:  Verbal  Consent given by:  Patient   Risks discussed:  Infection, need for additional repair, pain, poor cosmetic result and poor wound healing   Alternatives discussed:  No treatment and delayed treatment Universal protocol:    Patient identity confirmed:  Verbally with patient Anesthesia (see MAR for exact dosages):    Anesthesia method:  Nerve block   Block location:  Left thumb MCP   Block needle gauge:  25 G   Block anesthetic:  Lidocaine 2% w/o epi   Block injection procedure:  Anatomic landmarks identified, anatomic landmarks palpated, introduced needle, negative aspiration for blood and incremental injection   Block outcome:  Anesthesia achieved Laceration details:    Location:  Finger   Finger location:  L thumb   Length (cm):  1   Depth (mm):  4 Repair type:    Repair type:  Simple Pre-procedure details:    Preparation:  Patient was prepped and draped in usual sterile fashion Exploration:    Hemostasis achieved with:  Direct pressure   Wound exploration: wound explored through full range of motion     Contaminated: no   Treatment:    Area cleansed with:  Soap and water   Amount of cleaning:  Standard   Irrigation solution:  Tap water   Irrigation method:  Tap Skin repair:    Repair method:  Sutures   Suture size:  4-0   Suture material:  Prolene   Suture technique:  Simple interrupted   Number of sutures:  1 Approximation:    Approximation:  Close Post-procedure details:    Dressing:  Non-adherent dressing   Patient tolerance of procedure:  Tolerated well, no immediate complications   (including critical care time)  Medications Ordered in UC Medications - No data to display  Initial Impression / Assessment and Plan / UC Course  I have reviewed the triage vital signs and the nursing notes.  Pertinent labs & imaging results that were available during  my care of the patient were reviewed by me and considered in my medical decision making (see chart for details).     Wound thoroughly irrigated in office.  1 simple interrupted suture placed as patient works extensively with her hands.  Tolerated this well.  Low concern for infection given patient's health status.  Return precautions discussed, patient verbalized understanding and is agreeable to plan. Final Clinical Impressions(s) / UC Diagnoses   Final diagnoses:  Laceration of left thumb without foreign body without damage to nail, initial encounter     Discharge Instructions     Keep wound clean and dry. Return in 7-10 days for suture removal. Return sooner for worsening pain, swelling, redness, discharge, bad smell, fever.    ED Prescriptions    None     PDMP not reviewed this encounter.   [place], [place], [place] 03/06/20 2023

## 2020-03-06 NOTE — ED Triage Notes (Signed)
Pt states she accidentally cut her left thumb at the tip with a straight edge knife earlier today. Bleeding controlled. 1 cm laceration noted with approximated edges. Last tetanus 06/2016

## 2020-03-06 NOTE — Discharge Instructions (Signed)
Keep wound clean and dry. Return in 7-10 days for suture removal. Return sooner for worsening pain, swelling, redness, discharge, bad smell, fever.

## 2020-03-09 DIAGNOSIS — N6452 Nipple discharge: Secondary | ICD-10-CM | POA: Diagnosis not present

## 2020-03-16 DIAGNOSIS — N760 Acute vaginitis: Secondary | ICD-10-CM | POA: Diagnosis not present

## 2020-03-16 DIAGNOSIS — N898 Other specified noninflammatory disorders of vagina: Secondary | ICD-10-CM | POA: Diagnosis not present

## 2020-03-16 DIAGNOSIS — R102 Pelvic and perineal pain: Secondary | ICD-10-CM | POA: Diagnosis not present

## 2020-03-16 DIAGNOSIS — N941 Unspecified dyspareunia: Secondary | ICD-10-CM | POA: Diagnosis not present

## 2020-04-01 DIAGNOSIS — F3170 Bipolar disorder, currently in remission, most recent episode unspecified: Secondary | ICD-10-CM | POA: Diagnosis not present

## 2020-04-01 DIAGNOSIS — R69 Illness, unspecified: Secondary | ICD-10-CM | POA: Diagnosis not present

## 2020-04-01 DIAGNOSIS — Z79899 Other long term (current) drug therapy: Secondary | ICD-10-CM | POA: Diagnosis not present

## 2020-04-01 DIAGNOSIS — F0781 Postconcussional syndrome: Secondary | ICD-10-CM | POA: Diagnosis not present

## 2020-04-01 DIAGNOSIS — F902 Attention-deficit hyperactivity disorder, combined type: Secondary | ICD-10-CM | POA: Diagnosis not present

## 2020-04-06 DIAGNOSIS — R569 Unspecified convulsions: Secondary | ICD-10-CM | POA: Diagnosis not present

## 2020-04-10 DIAGNOSIS — R9401 Abnormal electroencephalogram [EEG]: Secondary | ICD-10-CM | POA: Diagnosis not present

## 2020-04-10 DIAGNOSIS — R569 Unspecified convulsions: Secondary | ICD-10-CM | POA: Diagnosis not present

## 2020-04-10 DIAGNOSIS — I820 Budd-Chiari syndrome: Secondary | ICD-10-CM | POA: Diagnosis not present

## 2020-04-10 DIAGNOSIS — R419 Unspecified symptoms and signs involving cognitive functions and awareness: Secondary | ICD-10-CM | POA: Diagnosis not present

## 2020-04-10 DIAGNOSIS — G43019 Migraine without aura, intractable, without status migrainosus: Secondary | ICD-10-CM | POA: Diagnosis not present

## 2020-04-10 DIAGNOSIS — G40209 Localization-related (focal) (partial) symptomatic epilepsy and epileptic syndromes with complex partial seizures, not intractable, without status epilepticus: Secondary | ICD-10-CM | POA: Diagnosis not present

## 2020-04-15 DIAGNOSIS — M25571 Pain in right ankle and joints of right foot: Secondary | ICD-10-CM | POA: Diagnosis not present

## 2020-04-15 DIAGNOSIS — S93401A Sprain of unspecified ligament of right ankle, initial encounter: Secondary | ICD-10-CM | POA: Diagnosis not present

## 2020-04-15 DIAGNOSIS — X58XXXA Exposure to other specified factors, initial encounter: Secondary | ICD-10-CM | POA: Diagnosis not present

## 2020-04-24 ENCOUNTER — Ambulatory Visit: Payer: Self-pay

## 2020-04-24 DIAGNOSIS — R0981 Nasal congestion: Secondary | ICD-10-CM | POA: Diagnosis not present

## 2020-04-24 DIAGNOSIS — J0101 Acute recurrent maxillary sinusitis: Secondary | ICD-10-CM | POA: Diagnosis not present

## 2020-04-30 DIAGNOSIS — R928 Other abnormal and inconclusive findings on diagnostic imaging of breast: Secondary | ICD-10-CM | POA: Diagnosis not present

## 2020-04-30 DIAGNOSIS — N6452 Nipple discharge: Secondary | ICD-10-CM | POA: Diagnosis not present

## 2020-05-04 DIAGNOSIS — N644 Mastodynia: Secondary | ICD-10-CM | POA: Diagnosis not present

## 2020-05-04 DIAGNOSIS — N6452 Nipple discharge: Secondary | ICD-10-CM | POA: Diagnosis not present

## 2020-05-27 DIAGNOSIS — R928 Other abnormal and inconclusive findings on diagnostic imaging of breast: Secondary | ICD-10-CM | POA: Diagnosis not present

## 2020-05-27 DIAGNOSIS — N6489 Other specified disorders of breast: Secondary | ICD-10-CM | POA: Diagnosis not present

## 2020-05-28 ENCOUNTER — Other Ambulatory Visit: Payer: Self-pay | Admitting: Obstetrics and Gynecology

## 2020-05-28 DIAGNOSIS — R9389 Abnormal findings on diagnostic imaging of other specified body structures: Secondary | ICD-10-CM

## 2020-06-03 IMAGING — CT CT ABD-PELV W/ CM
2 of 4 series · 16 of 46 positions shown, 18 images · IV contrast (Omnipaque)
Comparison: CT the abdomen and pelvis 02/08/2019.

CLINICAL DATA: 23-year-old female with history of right lower
quadrant abdominal pain since 5 a.m. today. Suspected acute
appendicitis.

EXAM:
CT ABDOMEN AND PELVIS WITH CONTRAST
TECHNIQUE: Multidetector CT imaging of the abdomen and pelvis was performed
using the standard protocol following bolus administration of
intravenous contrast.
CONTRAST:  100mL OMNIPAQUE IOHEXOL 300 MG/ML  SOLN

[Series 2: axial st · axial · 0.85mm/px · z∈[-438,-13]mm · 13 of 93 slices shown, 15 images]
[im 4/93  soft-tissue]
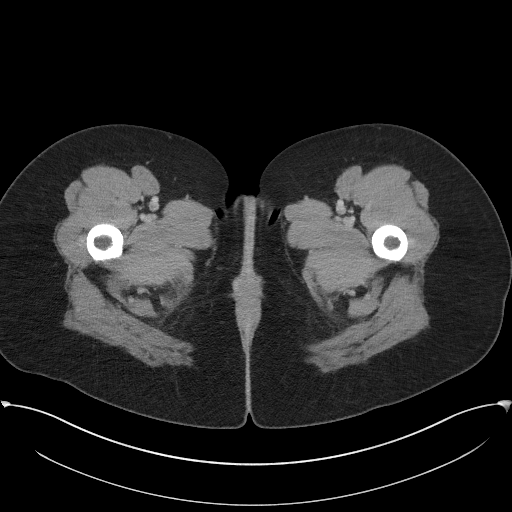
[im 4/93  bone]
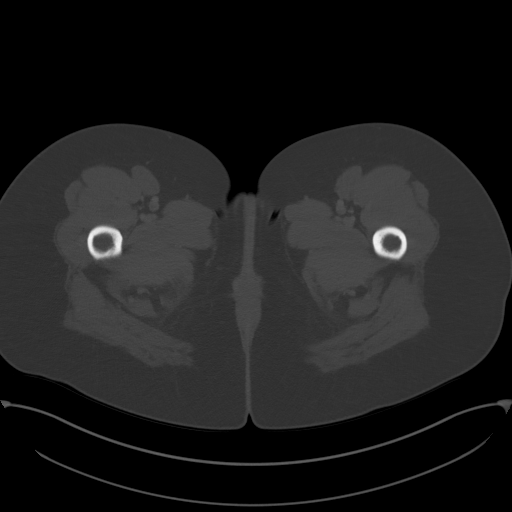
[im 12/93  soft-tissue]
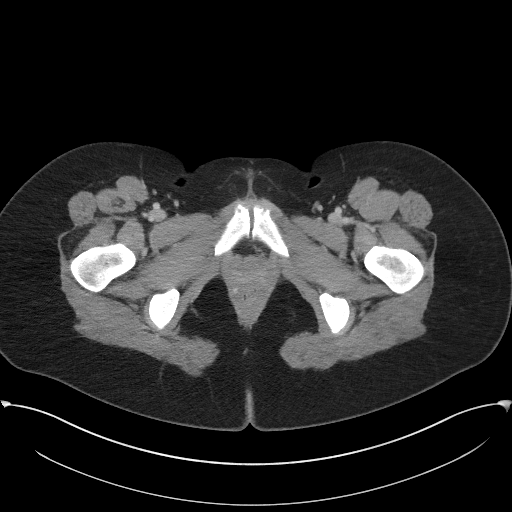
[im 20/93  soft-tissue]
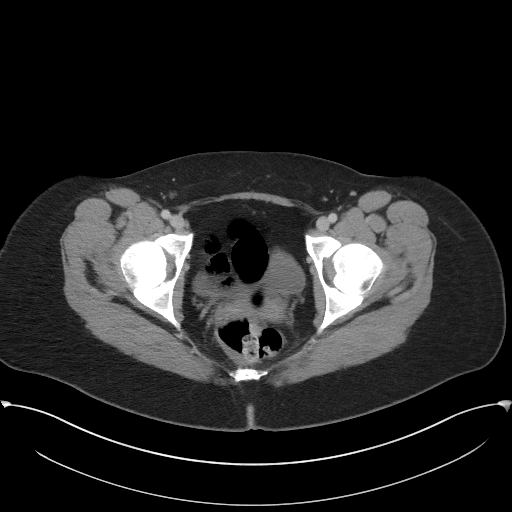
[im 27/93  soft-tissue]
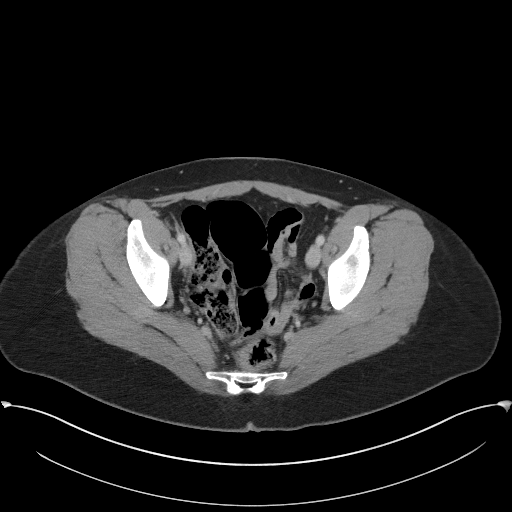
[im 31/93  soft-tissue]
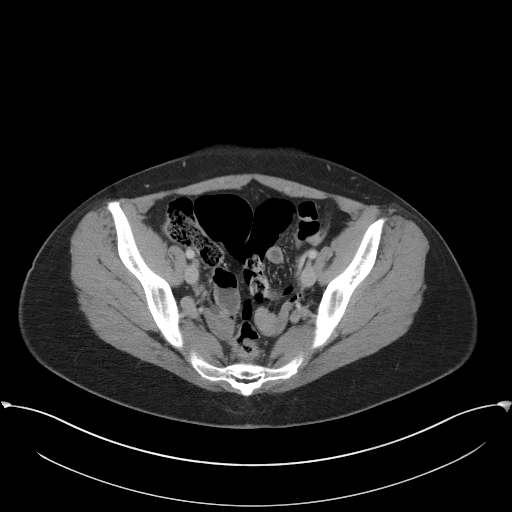
[im 39/93  soft-tissue]
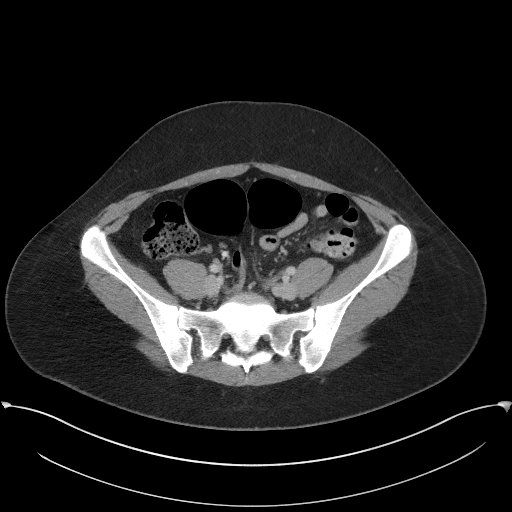
[im 47/93  soft-tissue]
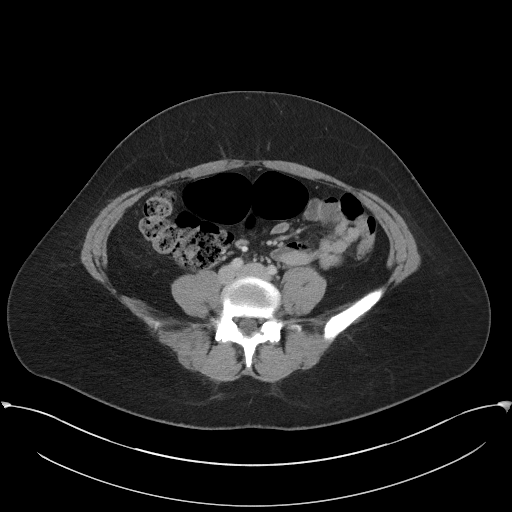
[im 54/93  soft-tissue]
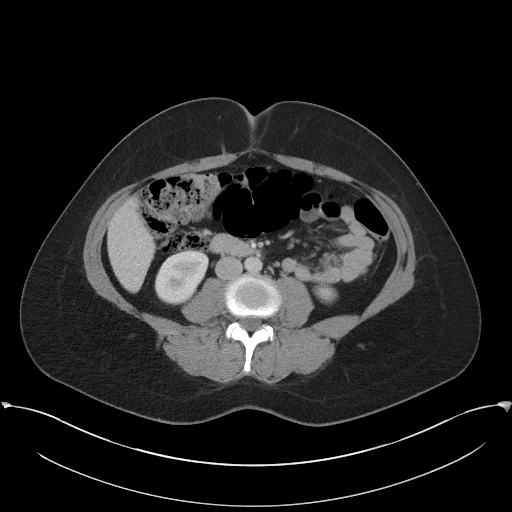
[im 62/93  soft-tissue]
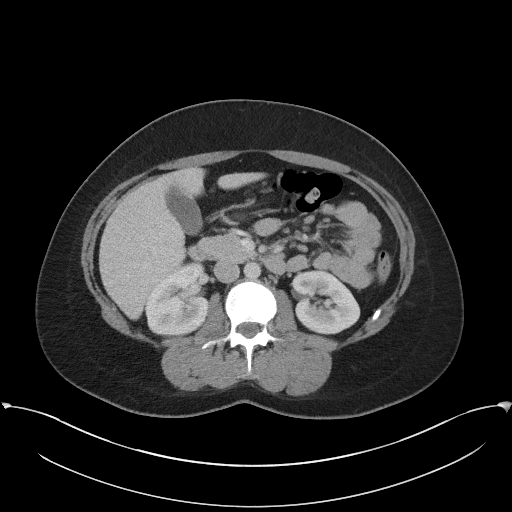
[im 62/93  bone]
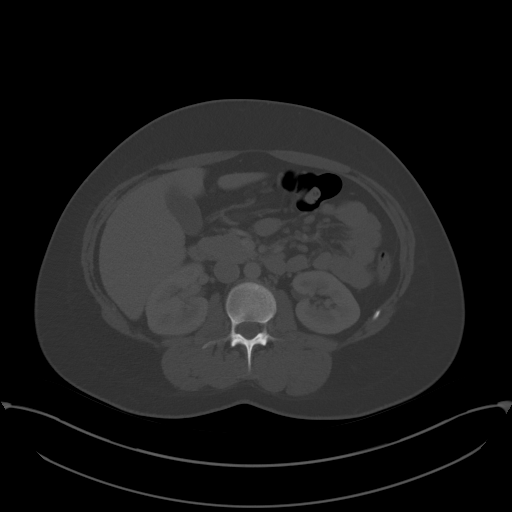
[im 66/93  soft-tissue]
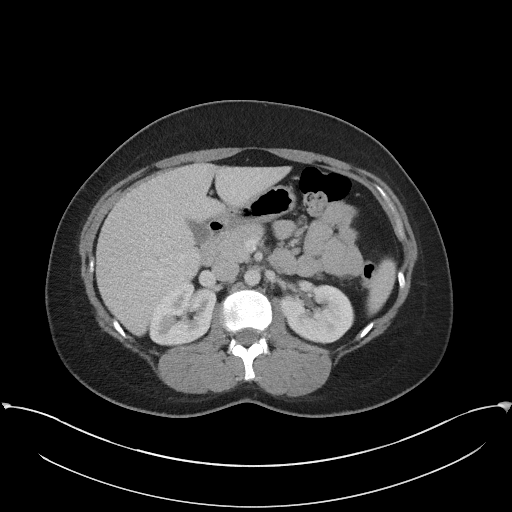
[im 73/93  soft-tissue]
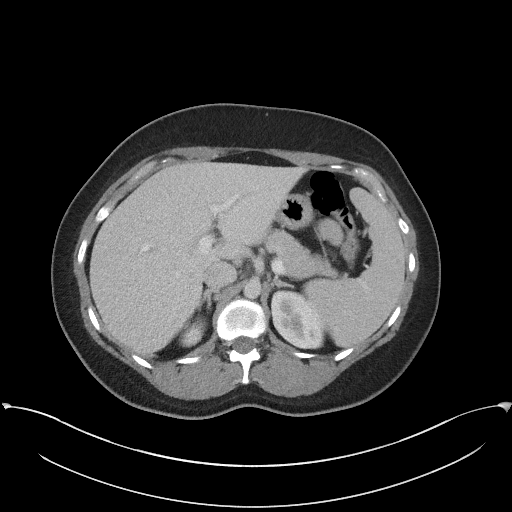
[im 81/93  soft-tissue]
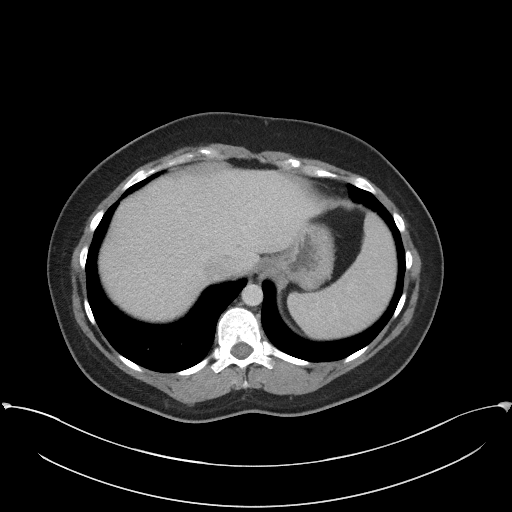
[im 89/93  soft-tissue]
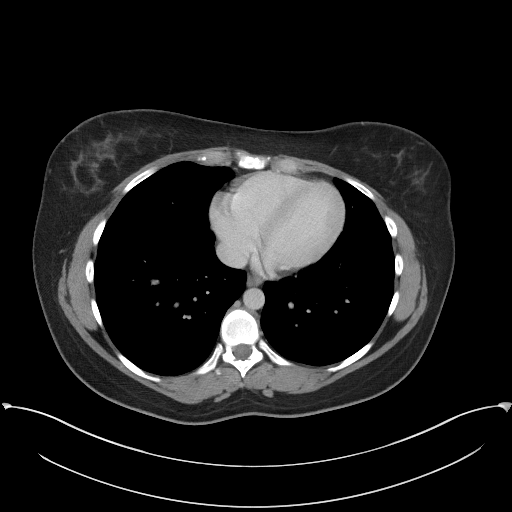

[Series 5: coronal st · coronal · 0.79mm/px · 3 of 92 slices shown]
[im 31/92  soft-tissue]
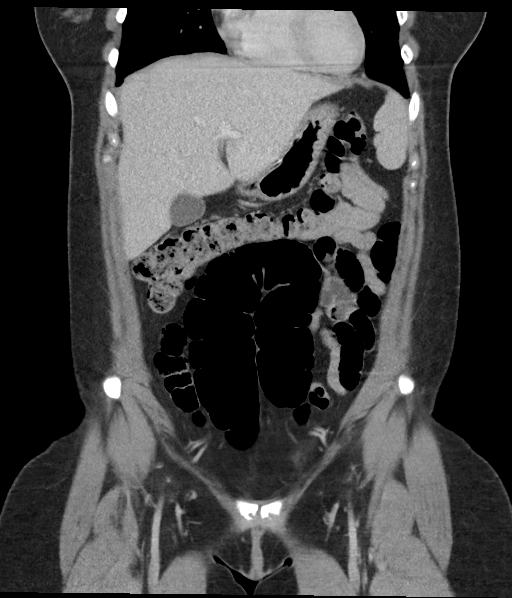
[im 41/92  soft-tissue]
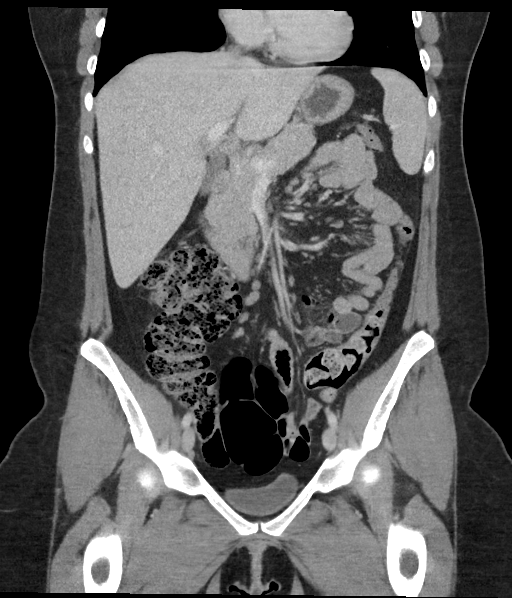
[im 51/92  soft-tissue]
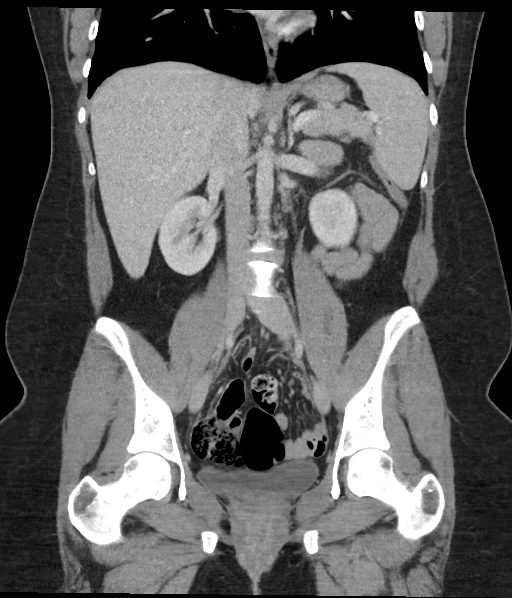

[16 of 46 positions shown; findings below may reference images not displayed]

FINDINGS: Lower chest: Unremarkable.

Hepatobiliary: No suspicious cystic or solid hepatic lesions. No
intra or extrahepatic biliary ductal dilatation. Gallbladder is
normal in appearance.

Pancreas: No pancreatic mass. No pancreatic ductal dilatation. No
pancreatic or peripancreatic fluid collections or inflammatory
changes.

Spleen: Unremarkable.

Adrenals/Urinary Tract: Bilateral kidneys and bilateral adrenal
glands are normal in appearance. No hydroureteronephrosis. Urinary
bladder is normal in appearance.

Stomach/Bowel: Normal appearance of the stomach. No pathologic
dilatation of small bowel or colon. The appendix is not confidently
identified and may be surgically absent. Regardless, there are no
inflammatory changes noted adjacent to the cecum to suggest the
presence of an acute appendicitis at this time.

Vascular/Lymphatic: No significant atherosclerotic disease, aneurysm
or dissection noted in the abdominal or pelvic vasculature. No
lymphadenopathy noted in the abdomen or pelvis.

Reproductive: Status post hysterectomy. Ovaries are not confidently
identified may be surgically absent or atrophic.

Other: No significant volume of ascites.  No pneumoperitoneum.

Musculoskeletal: There are no aggressive appearing lytic or blastic
lesions noted in the visualized portions of the skeleton.
IMPRESSION: 1. No acute findings are noted in the abdomen or pelvis to account
for the patient's symptoms.
2. The appendix is not confidently identified, but there are no
inflammatory changes adjacent to the cecum to suggest an acute
appendicitis at this time.
3. Additional incidental findings, as above.

## 2020-06-03 IMAGING — US US PELVIS COMPLETE TRANSABD/TRANSVAG W DUPLEX
1 series · 13 of 25 positions shown · non-contrast
Comparison: CT abdomen and pelvis 01/03/2020

CLINICAL DATA: RIGHT lower quadrant pain for 6 hours

EXAM:
TRANSABDOMINAL AND TRANSVAGINAL ULTRASOUND OF PELVIS
DOPPLER ULTRASOUND OF OVARIES
TECHNIQUE: Both transabdominal and transvaginal ultrasound examinations of the
pelvis were performed. Transabdominal technique was performed for
global imaging of the pelvis including uterus, ovaries, adnexal
regions, and pelvic cul-de-sac.
It was necessary to proceed with endovaginal exam following the
transabdominal exam to visualize the ovaries and adnexa. Color and
duplex Doppler ultrasound was utilized to evaluate blood flow to the
ovaries.

[Series 1: us pelvis complete transabd/transvag w duplex · 13 of 28 slices shown]
[im 1/28]
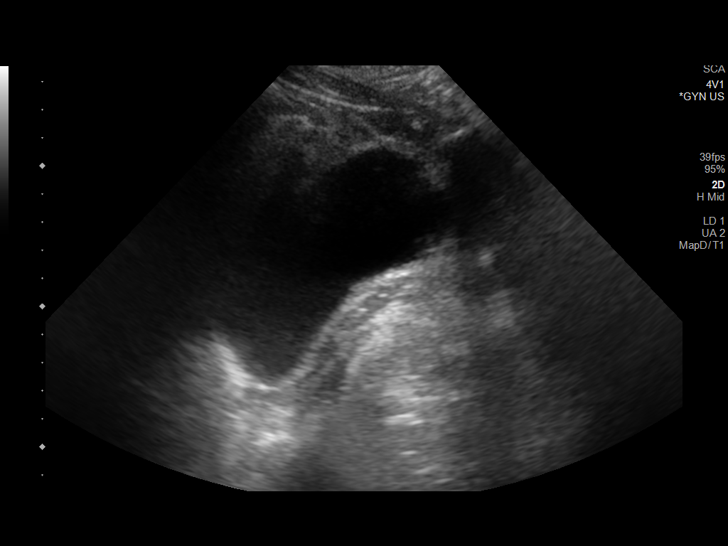
[im 3/28]
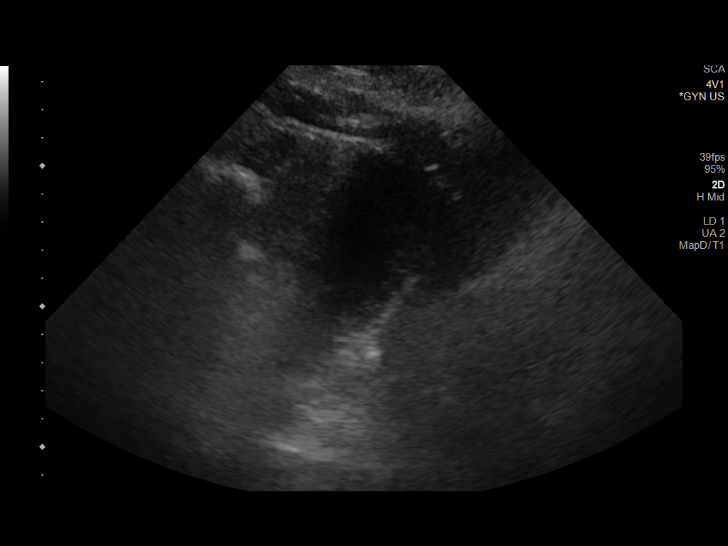
[im 5/28]
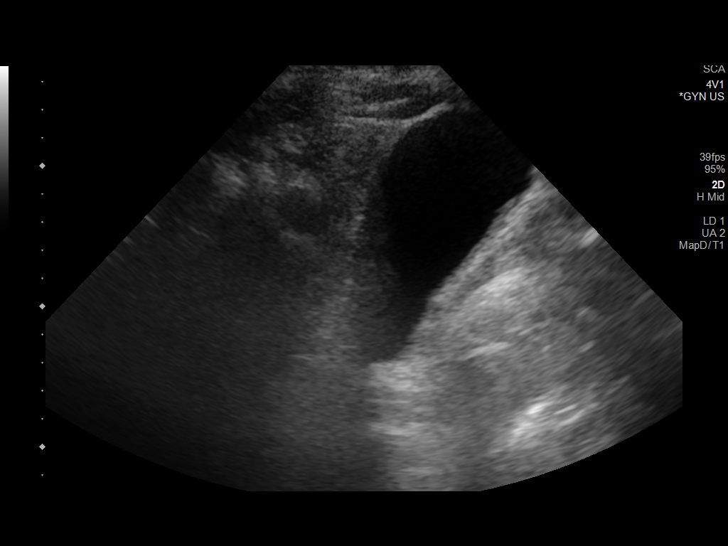
[im 7/28]
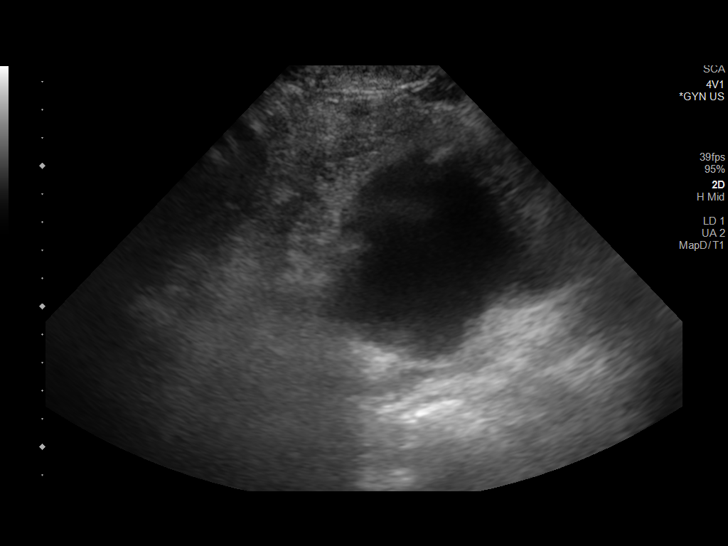
[im 10/28]
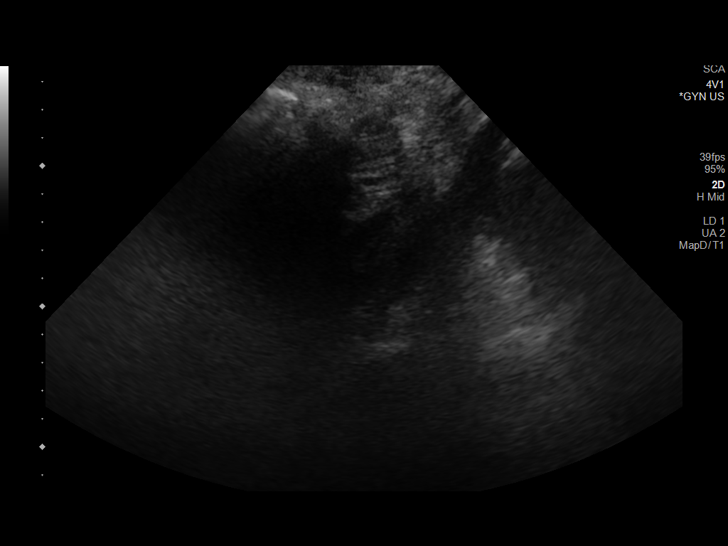
[im 12/28]
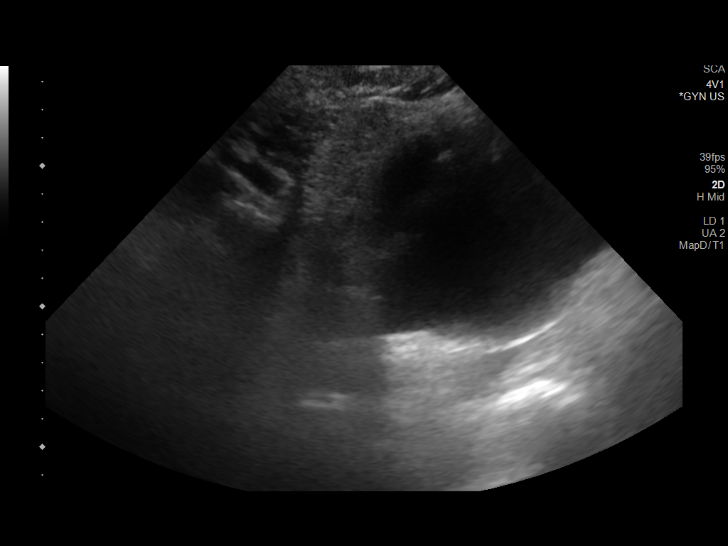
[im 14/28]
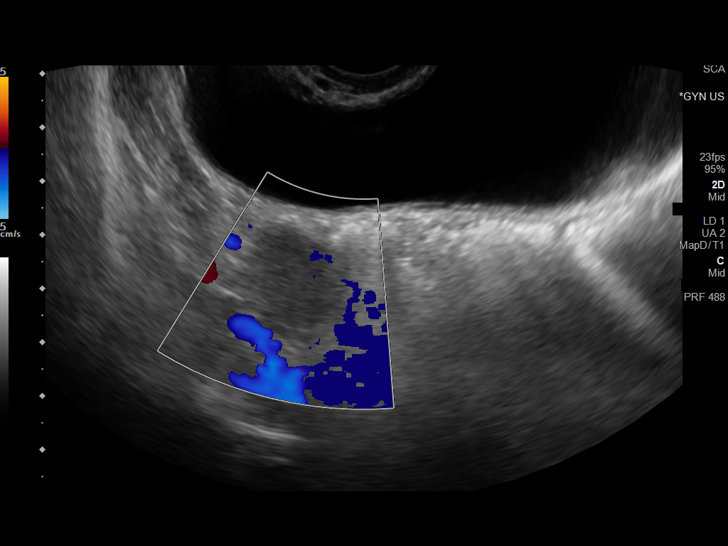
[im 16/28]
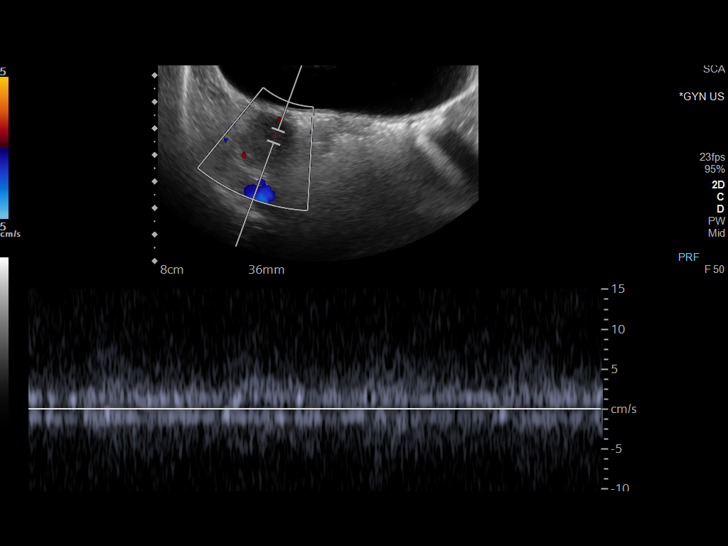
[im 19/28]
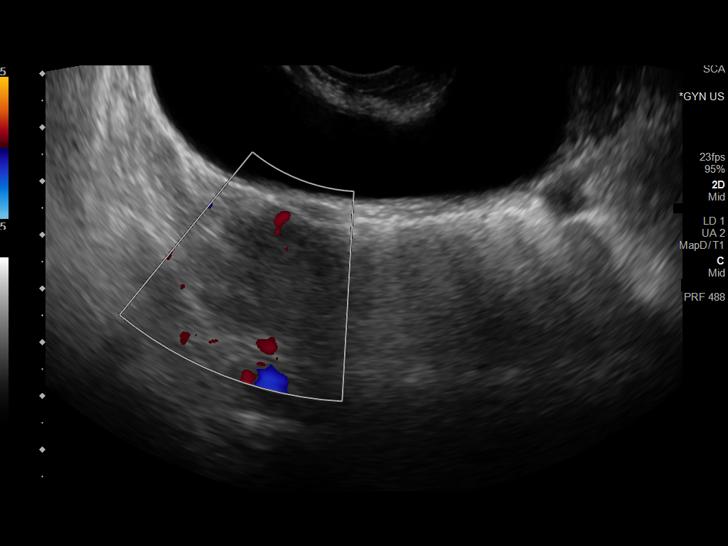
[im 21/28]
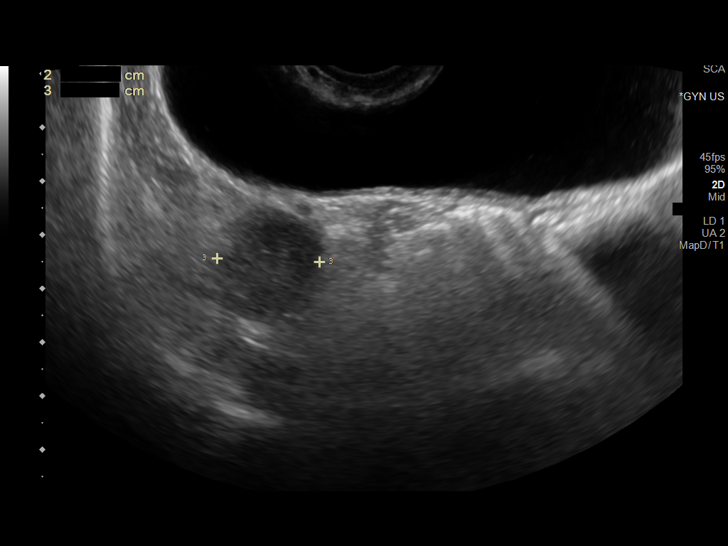
[im 23/28]
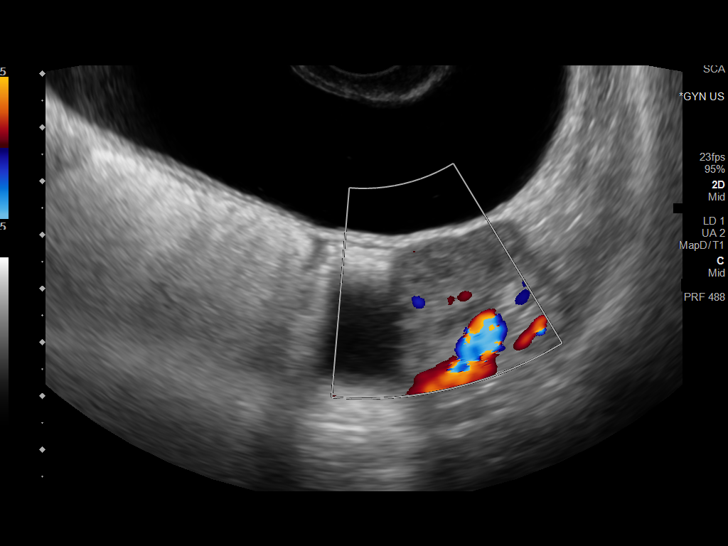
[im 25/28]
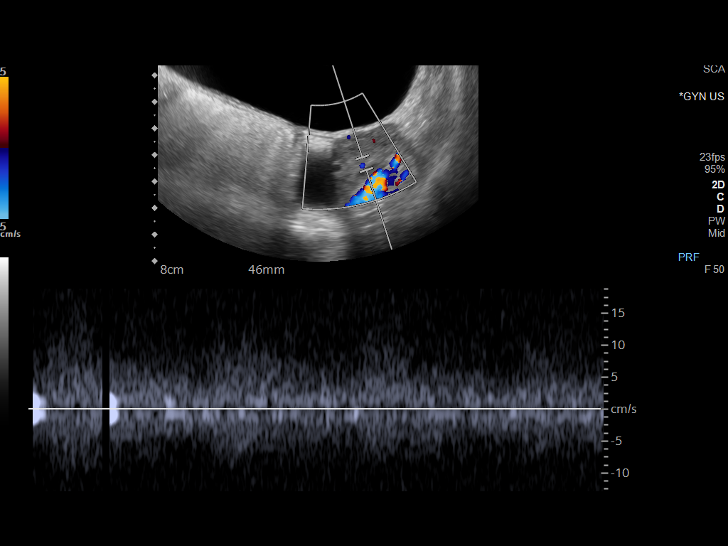
[im 28/28]
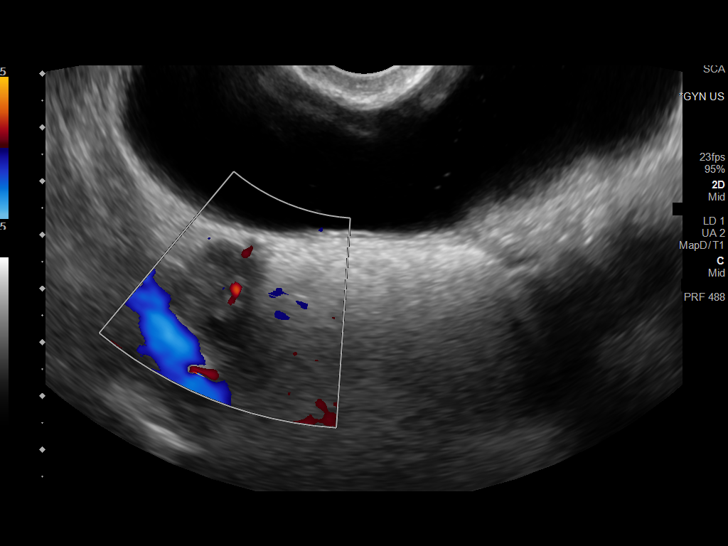

[13 of 25 positions shown; findings below may reference images not displayed]

FINDINGS: Uterus

Surgically absent

Endometrium

N/A

Right ovary

Measurements: 2.7 x 1.9 x 1.9 cm = volume: 5.2 mL. Normal morphology
without mass. Blood flow present within RIGHT ovary on color Doppler
imaging.

Left ovary

Measurements: 2.6 x 3.5 x 1.7 cm = volume: 8.4 mL. Small exophytic
dominant follicle without additional mass. Blood flow present within
LEFT ovary on color Doppler imaging.

Pulsed Doppler evaluation of both ovaries demonstrates normal
low-resistance arterial and venous waveforms.

Other findings

No free pelvic fluid.  No adnexal masses.
IMPRESSION: Post hysterectomy.

Unremarkable ovaries and adnexa.

No acute abnormalities.

## 2020-06-05 DIAGNOSIS — K0889 Other specified disorders of teeth and supporting structures: Secondary | ICD-10-CM | POA: Diagnosis not present

## 2020-06-06 ENCOUNTER — Ambulatory Visit: Payer: Self-pay

## 2020-06-10 ENCOUNTER — Inpatient Hospital Stay: Admission: RE | Admit: 2020-06-10 | Payer: Medicaid Other | Source: Ambulatory Visit

## 2020-06-17 DIAGNOSIS — D242 Benign neoplasm of left breast: Secondary | ICD-10-CM | POA: Diagnosis not present

## 2020-06-17 DIAGNOSIS — N6320 Unspecified lump in the left breast, unspecified quadrant: Secondary | ICD-10-CM | POA: Diagnosis not present

## 2020-06-17 DIAGNOSIS — N6342 Unspecified lump in left breast, subareolar: Secondary | ICD-10-CM | POA: Diagnosis not present

## 2020-07-01 DIAGNOSIS — N6459 Other signs and symptoms in breast: Secondary | ICD-10-CM | POA: Diagnosis not present

## 2020-07-01 DIAGNOSIS — D242 Benign neoplasm of left breast: Secondary | ICD-10-CM | POA: Diagnosis not present

## 2020-07-01 DIAGNOSIS — N63 Unspecified lump in unspecified breast: Secondary | ICD-10-CM | POA: Diagnosis not present

## 2020-07-21 DIAGNOSIS — D242 Benign neoplasm of left breast: Secondary | ICD-10-CM | POA: Diagnosis not present

## 2020-07-30 DIAGNOSIS — N6082 Other benign mammary dysplasias of left breast: Secondary | ICD-10-CM | POA: Diagnosis not present

## 2020-07-30 DIAGNOSIS — N6012 Diffuse cystic mastopathy of left breast: Secondary | ICD-10-CM | POA: Diagnosis not present

## 2020-07-30 DIAGNOSIS — N62 Hypertrophy of breast: Secondary | ICD-10-CM | POA: Diagnosis not present

## 2020-07-30 DIAGNOSIS — N6022 Fibroadenosis of left breast: Secondary | ICD-10-CM | POA: Diagnosis not present

## 2020-07-30 DIAGNOSIS — D242 Benign neoplasm of left breast: Secondary | ICD-10-CM | POA: Diagnosis not present

## 2020-07-30 DIAGNOSIS — N6032 Fibrosclerosis of left breast: Secondary | ICD-10-CM | POA: Diagnosis not present

## 2020-09-04 ENCOUNTER — Ambulatory Visit: Payer: Self-pay

## 2020-09-05 DIAGNOSIS — J0140 Acute pansinusitis, unspecified: Secondary | ICD-10-CM | POA: Diagnosis not present

## 2020-09-28 DIAGNOSIS — R69 Illness, unspecified: Secondary | ICD-10-CM | POA: Diagnosis not present

## 2020-09-28 DIAGNOSIS — F3181 Bipolar II disorder: Secondary | ICD-10-CM | POA: Diagnosis not present

## 2020-10-06 DIAGNOSIS — F4310 Post-traumatic stress disorder, unspecified: Secondary | ICD-10-CM | POA: Diagnosis not present

## 2020-10-06 DIAGNOSIS — R69 Illness, unspecified: Secondary | ICD-10-CM | POA: Diagnosis not present

## 2020-10-06 DIAGNOSIS — F4010 Social phobia, unspecified: Secondary | ICD-10-CM | POA: Diagnosis not present

## 2020-10-22 DIAGNOSIS — F4310 Post-traumatic stress disorder, unspecified: Secondary | ICD-10-CM | POA: Diagnosis not present

## 2020-10-22 DIAGNOSIS — F4010 Social phobia, unspecified: Secondary | ICD-10-CM | POA: Diagnosis not present

## 2020-10-22 DIAGNOSIS — R69 Illness, unspecified: Secondary | ICD-10-CM | POA: Diagnosis not present

## 2020-11-03 ENCOUNTER — Encounter (HOSPITAL_BASED_OUTPATIENT_CLINIC_OR_DEPARTMENT_OTHER): Payer: Self-pay

## 2020-11-03 ENCOUNTER — Other Ambulatory Visit: Payer: Self-pay

## 2020-11-03 ENCOUNTER — Emergency Department (HOSPITAL_BASED_OUTPATIENT_CLINIC_OR_DEPARTMENT_OTHER)
Admission: EM | Admit: 2020-11-03 | Discharge: 2020-11-03 | Disposition: A | Discharge status: Home / Self Care | Payer: No Typology Code available for payment source | Attending: Emergency Medicine | Admitting: Emergency Medicine

## 2020-11-03 DIAGNOSIS — R112 Nausea with vomiting, unspecified: Secondary | ICD-10-CM

## 2020-11-03 DIAGNOSIS — R000 Tachycardia, unspecified: Secondary | ICD-10-CM | POA: Diagnosis not present

## 2020-11-03 DIAGNOSIS — Z87891 Personal history of nicotine dependence: Secondary | ICD-10-CM | POA: Insufficient documentation

## 2020-11-03 DIAGNOSIS — R197 Diarrhea, unspecified: Secondary | ICD-10-CM | POA: Diagnosis not present

## 2020-11-03 DIAGNOSIS — Z20822 Contact with and (suspected) exposure to covid-19: Secondary | ICD-10-CM | POA: Insufficient documentation

## 2020-11-03 DIAGNOSIS — R6883 Chills (without fever): Secondary | ICD-10-CM | POA: Diagnosis not present

## 2020-11-03 LAB — URINALYSIS, ROUTINE W REFLEX MICROSCOPIC
Bilirubin Urine: NEGATIVE
Glucose, UA: NEGATIVE mg/dL
Hgb urine dipstick: NEGATIVE
Ketones, ur: NEGATIVE mg/dL
Leukocytes,Ua: NEGATIVE
Nitrite: NEGATIVE
Protein, ur: NEGATIVE mg/dL
Specific Gravity, Urine: 1.02 (ref 1.005–1.030)
pH: 6 (ref 5.0–8.0)

## 2020-11-03 LAB — CBC
HCT: 44.8 % (ref 36.0–46.0)
Hemoglobin: 15 g/dL (ref 12.0–15.0)
MCH: 27.6 pg (ref 26.0–34.0)
MCHC: 33.5 g/dL (ref 30.0–36.0)
MCV: 82.5 fL (ref 80.0–100.0)
Platelets: 225 10*3/uL (ref 150–400)
RBC: 5.43 MIL/uL — ABNORMAL HIGH (ref 3.87–5.11)
RDW: 13.1 % (ref 11.5–15.5)
WBC: 9 10*3/uL (ref 4.0–10.5)
nRBC: 0 % (ref 0.0–0.2)

## 2020-11-03 LAB — COMPREHENSIVE METABOLIC PANEL
ALT: 14 U/L (ref 0–44)
AST: 23 U/L (ref 15–41)
Albumin: 4.5 g/dL (ref 3.5–5.0)
Alkaline Phosphatase: 73 U/L (ref 38–126)
Anion gap: 9 (ref 5–15)
BUN: 9 mg/dL (ref 6–20)
CO2: 23 mmol/L (ref 22–32)
Calcium: 9.6 mg/dL (ref 8.9–10.3)
Chloride: 100 mmol/L (ref 98–111)
Creatinine, Ser: 0.67 mg/dL (ref 0.44–1.00)
GFR, Estimated: >60 mL/min (ref >60)
Glucose, Bld: 119 mg/dL — ABNORMAL HIGH (ref 70–99)
Potassium: 3.6 mmol/L (ref 3.5–5.1)
Sodium: 132 mmol/L — ABNORMAL LOW (ref 135–145)
Total Bilirubin: 0.6 mg/dL (ref 0.3–1.2)
Total Protein: 8.5 g/dL — ABNORMAL HIGH (ref 6.5–8.1)

## 2020-11-03 LAB — LIPASE, BLOOD: Lipase: 27 U/L (ref 11–51)

## 2020-11-03 MED ORDER — ONDANSETRON HCL 4 MG/2ML IJ SOLN
4.0000 mg | Freq: Once | INTRAMUSCULAR | Status: AC
  Administered 2020-11-03: 4 mg via INTRAVENOUS
  Filled 2020-11-03: qty 2

## 2020-11-03 MED ORDER — ONDANSETRON HCL 4 MG PO TABS: 4.0000 mg | ORAL_TABLET | Freq: Three times a day (TID) | ORAL | 0 refills | Status: DC | PRN

## 2020-11-03 MED ORDER — SODIUM CHLORIDE 0.9 % IV BOLUS
1000.0000 mL | Freq: Once | INTRAVENOUS | Status: AC
  Administered 2020-11-03: 1000 mL via INTRAVENOUS

## 2020-11-03 NOTE — ED Provider Notes (Signed)
[facility] Provider Note   CSN: [id number] Arrival date & time: 11/03/20  1322     History Chief Complaint  Patient presents with  . Diarrhea    Christina Skinner is a 25 y.o. female.  HPI   Patient with no significant medical history presents with complaint of nausea, vomiting, diarrhea for the last 36 hours.  Patient endorses that she has  chills, general body aches, nausea, vomiting, diarrhea, she denies seeing any blood in her stool, vomit, or urine.  She denies  abdominal pain, she states she is unable to tolerate p.o. as she becomes extremely nauseous.  She has no significant abdominal history, no history of pancreatitis, kidney stones, gallstones, she denies NSAID use, alcohol use.  She does endorse that her daughter was recently diagnosed with a gastroenteritis virus and she thinks she now has it.  She is vaccine is COVID-19, is not immunocompromise.  She denies any alleviating factors.  Patient denies nasal congestion, sore throat, cough, chest pain, shortness of breath, abdominal pain, urinary symptoms, worsening pedal edema. Past Medical History:  Diagnosis Date  . Anemia   . Anxiety   . Bipolar depression ([place])   . Borderline personality disorder ([place])   . Depression   . Endometriosis   . Family history of breast cancer   . Family history of ovarian cancer   . GERD (gastroesophageal reflux disease)   . H/O: substance abuse ([place])   . Heart murmur   . IBS (irritable bowel syndrome)   . Panic attack   . PTSD (post-traumatic stress disorder)   . Seasonal allergies   . Seizures ([place])   . Temporal lobe epilepsy [facility])     Patient Active Problem List   Diagnosis Date Noted  . Iron deficiency anemia due to chronic blood loss 12/18/2015  . Fatigue 12/18/2015  . Pica in adults 12/18/2015    Past Surgical History:  Procedure Laterality Date  . ABDOMINAL HYSTERECTOMY    . EYE SURGERY    . KNEE SURGERY Bilateral   . LAPAROSCOPIC  ABDOMINAL EXPLORATION    . LIVER BIOPSY    . OVARIAN CYST REMOVAL    . TONSILLECTOMY    . TUBAL LIGATION    . WISDOM TOOTH EXTRACTION       OB History   No obstetric history on file.     Family History  Problem Relation Age of Onset  . Bipolar disorder Mother   . Breast cancer Mother   . Hypertension Mother   . Mental illness Mother   . Rheum arthritis Mother   . Seizures Mother   . Diabetes Maternal Grandmother   . Heart attack Maternal Grandmother   . Heart disease Maternal Grandmother   . Hypertension Maternal Grandmother   . Hyperlipidemia Maternal Grandmother   . Breast cancer Paternal Aunt   . Breast cancer Paternal Aunt   . Ovarian cancer Maternal Aunt   . Breast cancer Maternal Aunt   . Diabetes Maternal Aunt     Social History   Tobacco Use  . Smoking status: Former Research scientist (life sciences)  . Smokeless tobacco: Never Used  Vaping Use  . Vaping Use: Never used  Substance Use Topics  . Alcohol use: No  . Drug use: Not Currently    Home Medications Prior to Admission medications   Medication Sig Start Date End Date Taking? Authorizing Provider  ondansetron (ZOFRAN) 4 MG tablet Take 1 tablet (4 mg total) by mouth every 8 (eight) hours as needed  for nausea or vomiting. 11/03/20  Yes Marcello Fennel, PA-C  azelastine (ASTELIN) 0.1 % nasal spray Place into the nose. 01/14/20   [provider]  diclofenac (VOLTAREN) 75 MG EC tablet Take 75 mg by mouth 2 (two) times daily. 10/04/19   [provider]  fluconazole (DIFLUCAN) 150 MG tablet Take 1 tablet by mouth on Day 1. Take the additional tablet on Day 4 if you are still experiencing symptoms 01/03/20   Eustaquio Maize, PA-C  gabapentin (NEURONTIN) 300 MG capsule Take 300 mg by mouth 3 (three) times daily.    [provider]  lamoTRIgine (LAMICTAL) 200 MG tablet Take 1 tablet by mouth daily. 03/08/17   [provider]  levETIRAcetam (KEPPRA) 250 MG tablet Take 250 mg by mouth 2 (two) times  daily. 12/18/19   [provider]  methylphenidate (RITALIN) 10 MG tablet Take 10 mg by mouth daily. 12/10/19   [provider]  ondansetron (ZOFRAN ODT) 4 MG disintegrating tablet Take 1 tablet (4 mg total) by mouth every 8 (eight) hours as needed for nausea or vomiting. 01/03/20   Venter, Margaux, PA-C  RETIN-A 0.025 % cream APPLY A PEA SIZED AMOUNT AS A THIN LAYER TO ENTIRE FACE NIGHTLY 09/09/19   [provider]  sertraline (ZOLOFT) 100 MG tablet Take 2 tablets by mouth daily. 08/08/18   [provider]  spironolactone (ALDACTONE) 50 MG tablet Take 50 mg by mouth daily. 12/03/19   [provider]  triamcinolone ointment (KENALOG) 0.1 % Apply to [place] areas of body twice daily as needed. NEVER to face/groin 06/27/19   [provider]    Allergies    Ketorolac, Tape, Amoxicillin, Keflex [cephalexin], Other, Sprintec 28  [norgestimate-eth estradiol], Tylenol with codeine #3 [acetaminophen-codeine], Phenergan [promethazine hcl], and Tramadol  Review of Systems   Review of Systems  Constitutional: Positive for chills. Negative for fever.  HENT: Negative for congestion and sore throat.   Eyes: Negative for visual disturbance.  Respiratory: Negative for shortness of breath.   Cardiovascular: Negative for chest pain.  Gastrointestinal: Positive for diarrhea, nausea and vomiting. Negative for abdominal pain.  Genitourinary: Negative for difficulty urinating, enuresis and vaginal pain.  Musculoskeletal: Positive for myalgias. Negative for back pain.  Skin: Negative for rash.  Neurological: Negative for dizziness and headaches.  Hematological: Does not bruise/bleed easily.    Physical Exam Updated Vital Signs BP 117/84 (BP Location: Right Arm)   Pulse (!) 117   Temp 98.9 F (37.2 C) (Oral)   Resp 16   LMP 09/27/2015   SpO2 100%   Physical Exam Vitals and nursing note reviewed.  Constitutional:      General: She is not in acute  distress.    Appearance: She is not ill-appearing.  HENT:     Head: Normocephalic and atraumatic.     Nose: No congestion.     Mouth/Throat:     Mouth: Mucous membranes are moist.     Pharynx: Oropharynx is clear. No oropharyngeal exudate or posterior oropharyngeal erythema.  Eyes:     Conjunctiva/sclera: Conjunctivae normal.  Cardiovascular:     Rate and Rhythm: Regular rhythm. Tachycardia present.     Pulses: Normal pulses.     Heart sounds: No murmur heard. No friction rub. No gallop.   Pulmonary:     Effort: No respiratory distress.     Breath sounds: No wheezing, rhonchi or rales.  Abdominal:     Palpations: Abdomen is soft.     Tenderness:  There is no abdominal tenderness.  Musculoskeletal:     Right lower leg: No edema.     Left lower leg: No edema.     Comments: Patient is moving all 4 extremities at difficulty.  Skin:    General: Skin is warm and dry.  Neurological:     Mental Status: She is alert.  Psychiatric:        Mood and Affect: Mood normal.     ED Results / Procedures / Treatments   Labs (all labs ordered are listed, but only abnormal results are displayed) Labs Reviewed  COMPREHENSIVE METABOLIC PANEL - Abnormal; Notable for the following components:      Result Value   Sodium 132 (*)    Glucose, Bld 119 (*)    Total Protein 8.5 (*)    All other components within normal limits  CBC - Abnormal; Notable for the following components:   RBC 5.43 (*)    All other components within normal limits  URINALYSIS, ROUTINE W REFLEX MICROSCOPIC - Abnormal; Notable for the following components:   APPearance HAZY (*)    All other components within normal limits  SARS CORONAVIRUS 2 (TAT 6-24 HRS)  LIPASE, BLOOD    EKG EKG Interpretation  Date/Time:  Tuesday November 03 2020 14:38:42 EST Ventricular Rate:  111 PR Interval:    QRS Duration: 93 QT Interval:  312 QTC Calculation: 424 R Axis:   44 Text Interpretation: Sinus tachycardia No old tracing to  compare Confirmed by Aletta Edouard [phone]) on 11/03/2020 2:48:17 PM   Radiology No results found.  Procedures Procedures   Medications Ordered in ED Medications  sodium chloride 0.9 % bolus 1,000 mL (0 mLs Intravenous Stopped 11/03/20 1517)  ondansetron (ZOFRAN) injection 4 mg (4 mg Intravenous Given 11/03/20 1450)    ED Course  I have reviewed the triage vital signs and the nursing notes.  Pertinent labs & imaging results that were available during my care of the patient were reviewed by me and considered in my medical decision making (see chart for details).    MDM Rules/Calculators/A&P                          Initial impression-patient presents with nausea, vomiting, diarrhea, patient was alert, does not appear in acute distress, vital signs show tachycardia.  Lab work was obtained by triage, will start patient on fluids, Zofran, and reevaluate.  Work-up-CBC negative for leukocytosis, no signs of anemia.  CMP shows slight hyponatremia of 132, slight hyperglycemia of 119, no elevated liver enzymes, no anion gap present.  UA negative for nitrates, leukocytes, hematuria.  Lipase 27.  EKG sinus tach without signs of ischemia no ST elevation depression noted.  Reassessment nursing staff notified me that patient would like to leave at this time as she needs to attend to a family emergency.  Patient is reassessed, she states she is feeling much better, has no complaints at this time, patient is still tachycardic but tachycardia has improved, she is tolerating p.o.  Rule out-low suspicion for systemic infection as patient is nontoxic-appearing, vital signs reassuring, no obvious source infection on my exam.  Low suspicion for gallbladder abnormality as there is no right upper quadrant pain, elevation liver or alk phos.  Low suspicion for pancreatitis as she has low risk factors, lipase is 27.  Low suspicion for perforated stomach ulcer as she has low risk factors, abdomen soft nondistended,  no peritoneal signs on my exam.  Low suspicion for ACS as patient had chest pain, shortness of breath, no signs of hypoperfusion fluid overload on my exam, EKG sinus tach without signs of ischemia.  Low suspicion for PE as patient denies pleuritic chest pain, shortness of breath, no leg swelling, patient is low risk factors.  Plan-I suspect patient suffering from gastroenteritis, and she is tachycardic from dehydration.  Will provide patient with antiemetics and recommend she continue to hydrate.  Vital signs have remained stable, no indication for hospital admission.  Patient given at home care as well strict return precautions.  Patient verbalized that they understood agreed to said plan.   Final Clinical Impression(s) / ED Diagnoses Final diagnoses:  Nausea vomiting and diarrhea    Rx / DC Orders ED Discharge Orders         Ordered    ondansetron (ZOFRAN) 4 MG tablet  Every 8 hours PRN        11/03/20 1520           Aron Baba 11/03/20 1535    Hayden Rasmussen, MD 11/03/20 1729

## 2020-11-03 NOTE — ED Triage Notes (Addendum)
Pt c/o n/v/d, fever x 24 hours-reports another family member with similar sx-NAD-steady gait

## 2020-11-03 NOTE — Discharge Instructions (Signed)
You have been seen here for URI like symptoms.  Given you a prescription for Zofran please take as prescribed.  This will help with your nausea.  I also recommend taking Tylenol for fever control and ibuprofen for pain control please follow dosing on the back of bottle.  I recommend staying hydrated and if you do not an appetite, I recommend soups as this will provide you with fluids and calories.  Your Covid test is pending I recommend self quarantine until you get your results back on MyChart.    If you are Covid positive you must self quarantine for 5 days starting on symptom onset, if at the end of those 5 days you are feeling better you may return back to school/work, if you continue to have symptoms you must self quarantine for additional 5 days.  I would like you to contact "post Covid care" as they will provide you with information how to manage your Covid symptoms if you are Covid positive.  Or if you are Covid negative and continue of symptoms after 1 to 2 weeks you may follow-up with your primary care provider  Come back to the emergency department if you develop chest pain, shortness of breath, severe abdominal pain, uncontrolled nausea, vomiting, diarrhea.

## 2020-11-03 NOTE — ED Notes (Signed)
For the past 24 hours pt states she has had a low grade fever, states having nausea, vomiting and diarrhea. Daughter was at hospital last PM with stomach virus per her hx provided. IV initiated, placed on cont POX readings with int NBP assessment, HR noted to be > 120/min, IVF NS bolus initiated, ED provider informed

## 2020-11-04 ENCOUNTER — Telehealth: Payer: Self-pay | Admitting: Nurse Practitioner

## 2020-11-04 LAB — SARS CORONAVIRUS 2 (TAT 6-24 HRS): SARS Coronavirus 2: NEGATIVE

## 2020-11-04 NOTE — Telephone Encounter (Signed)
Pt was called VM was left informing Pt to call the office for a appointment if they needed our services

## 2020-11-04 NOTE — Telephone Encounter (Signed)
Pt was called Vm was left informing Pt if she needed our assistants to please call the office

## 2020-11-12 ENCOUNTER — Encounter: Payer: Self-pay | Admitting: Physician Assistant

## 2020-11-12 ENCOUNTER — Telehealth: Payer: No Typology Code available for payment source | Admitting: Physician Assistant

## 2020-11-12 DIAGNOSIS — B9689 Other specified bacterial agents as the cause of diseases classified elsewhere: Secondary | ICD-10-CM

## 2020-11-12 DIAGNOSIS — J019 Acute sinusitis, unspecified: Secondary | ICD-10-CM | POA: Diagnosis not present

## 2020-11-12 MED ORDER — AZITHROMYCIN 250 MG PO TABS: ORAL_TABLET | ORAL | 0 refills | Status: AC

## 2020-11-12 MED ORDER — PREDNISONE 10 MG PO TABS: 30.0000 mg | ORAL_TABLET | Freq: Every day | ORAL | 0 refills | Status: AC

## 2020-11-12 NOTE — Patient Instructions (Signed)
Instructions sent to patients MyChart.

## 2020-11-12 NOTE — Progress Notes (Signed)
[name], Christina Skinner are scheduled for a virtual visit with your provider today.    Just as we do with appointments in the office, we must obtain your consent to participate.  Your consent will be active for this visit and any virtual visit you may have with one of our providers in the next 365 days.    If you have a MyChart account, I can also send a copy of this consent to you electronically.  All virtual visits are billed to your insurance company just like a traditional visit in the office.  As this is a virtual visit, video technology does not allow for your provider to perform a traditional examination.  This may limit your provider's ability to fully assess your condition.  If your provider identifies any concerns that need to be evaluated in person or the need to arrange testing such as labs, EKG, etc, we will make arrangements to do so.    Although advances in technology are sophisticated, we cannot ensure that it will always work on either your end or our end.  If the connection with a video visit is poor, we may have to switch to a telephone visit.  With either a video or telephone visit, we are not always able to ensure that we have a secure connection.   I need to obtain your verbal consent now.   Are you willing to proceed with your visit today?   Kessler Marrin has provided verbal consent on 11/12/2020 for a virtual visit (video or telephone).   Piedad Climes, PA-C 11/12/2020  5:29 PM    Virtual Visit via Video   I connected with patient on 11/12/20 at  5:45 PM EST by a video enabled telemedicine application and verified that I am speaking with the correct person using two identifiers.  Location patient: Home Location provider: [facility] Persons participating in the virtual visit: Patient, Provider  I discussed the limitations of evaluation and management by telemedicine and the availability of in person appointments. The patient expressed understanding and  agreed to proceed.  Subjective:   HPI:   Patient presents via [facility] today complaining of 3 to 4 weeks of nasal congestion with nasal pressure, thick postnasal drainage with AM scratchy throat, sinus headache and frontal facial pain bilaterally. Notes she is getting up dark green phlegm from her drainage. States it is hard to get anything out of her nose. Does note occasional right maxillary sinus pain. Denies fever, chills or aches. Did have a mild gastroenteritis a few weeks ago at which time she was evaluated at the ER with no concerning findings. Covid test was obtained at that time giving she did have these URI symptoms present and was negative. Patient endorses taking over-the-counter Tylenol Cold and flu, Robitussin, tea with honey. Is using Flonase daily. Patient states she has multidrug allergies and has an intolerance to other antibiotics including sulfa and doxycycline which cause nausea and vomiting. Patient states she has a significant history of sinusitis, getting about 3 to 4/year. Is currently without a primary care provider. Notes her previous primary care provider will give her azithromycin due to her allergies/intolerances, oftentimes along with a low dose of prednisone.   ROS:   See pertinent positives and negatives per HPI.  Patient Active Problem List   Diagnosis Date Noted  . Iron deficiency anemia due to chronic blood loss 12/18/2015  . Fatigue 12/18/2015  . Pica in adults 12/18/2015    Social History  Tobacco Use  . Smoking status: Former Games developer  . Smokeless tobacco: Never Used  Substance Use Topics  . Alcohol use: No    Current Outpatient Medications:  .  azelastine (ASTELIN) 0.1 % nasal spray, Place into the nose., Disp: , Rfl:  .  diclofenac (VOLTAREN) 75 MG EC tablet, Take 75 mg by mouth 2 (two) times daily., Disp: , Rfl:  .  fluconazole (DIFLUCAN) 150 MG tablet, Take 1 tablet by mouth on Day 1. Take the additional tablet on Day 4 if you are still  experiencing symptoms, Disp: 2 tablet, Rfl: 0 .  gabapentin (NEURONTIN) 300 MG capsule, Take 300 mg by mouth 3 (three) times daily., Disp: , Rfl:  .  lamoTRIgine (LAMICTAL) 200 MG tablet, Take 1 tablet by mouth daily., Disp: , Rfl:  .  levETIRAcetam (KEPPRA) 250 MG tablet, Take 250 mg by mouth 2 (two) times daily., Disp: , Rfl:  .  methylphenidate (RITALIN) 10 MG tablet, Take 10 mg by mouth daily., Disp: , Rfl:  .  ondansetron (ZOFRAN ODT) 4 MG disintegrating tablet, Take 1 tablet (4 mg total) by mouth every 8 (eight) hours as needed for nausea or vomiting., Disp: 20 tablet, Rfl: 0 .  ondansetron (ZOFRAN) 4 MG tablet, Take 1 tablet (4 mg total) by mouth every 8 (eight) hours as needed for nausea or vomiting., Disp: 12 tablet, Rfl: 0 .  RETIN-A 0.025 % cream, APPLY A PEA SIZED AMOUNT AS A THIN LAYER TO ENTIRE FACE NIGHTLY, Disp: , Rfl:  .  sertraline (ZOLOFT) 100 MG tablet, Take 2 tablets by mouth daily., Disp: , Rfl:  .  spironolactone (ALDACTONE) 50 MG tablet, Take 50 mg by mouth daily., Disp: , Rfl:  .  triamcinolone ointment (KENALOG) 0.1 %, Apply to THICK ROUGH areas of body twice daily as needed. NEVER to face/groin, Disp: , Rfl:   Allergies  Allergen Reactions  . Ketorolac Nausea Only and Hives  . Tape Other (See Comments) and Rash    Leaves red marks on skin. Bandage tape causes skin to break out and causes rashes  Leaves red marks on skin.   . Amoxicillin Hives    Has patient had a PCN reaction causing immediate rash, facial/tongue/throat swelling, SOB or lightheadedness with hypotension: Yes Has patient had a PCN reaction causing severe rash involving mucus membranes or skin necrosis: No Has patient had a PCN reaction that required hospitalization No Has patient had a PCN reaction occurring within the last 10 years: No If all of the above answers are "NO", then may proceed with Cephalosporin use.   [name] Kitchen Keflex [Cephalexin] Hives  . Other     "migraine cocktail"-tachcardia  .  Sprintec 28  [Norgestimate-Eth Estradiol] Nausea And Vomiting    Sprintec  . Tylenol With Codeine #3 [Acetaminophen-Codeine] Hives  . Phenergan [Promethazine Hcl] Other (See Comments)    Paranoia; only IV route   . Tramadol Hives, Nausea Only and Palpitations    Objective:   LMP 09/27/2015   Patient is well-developed, well-nourished in no acute distress.  Resting comfortably at home.  Head is normocephalic, atraumatic.  No labored breathing.  Speech is clear and coherent with logical content.  Patient is alert and oriented at baseline.   Assessment and Plan:   1. Acute bacterial sinusitis Rx azithromycin given multiple drug allergies/intolerances.  Increase fluids.  Rest.  Saline nasal spray.  Probiotic.  Mucinex as directed.  Humidifier in bedroom. We will have her continue Flonase. We will add on 3-day burst  of prednisone 30 mg (avoiding higher dose given her history of bipolar depression). She is encouraged to follow-up with her PCP if establishing before symptom resolution. Otherwise if symptoms or not improving she needs to reach back out to [place] or be seen in person if symptoms are worsening. Patient voiced understanding and agreement with the plan. A written copy of instructions was sent to the patient's MyChart. - azithromycin (ZITHROMAX) 250 MG tablet; Take 2 tablets on day 1. Then take 1 tablet daily.  Dispense: 6 tablet; Refill: 0 - predniSONE (DELTASONE) 10 MG tablet; Take 3 tablets (30 mg total) by mouth daily with breakfast for 3 days.  Dispense: 9 tablet; Refill: 0 .   Piedad Climes, [place] 11/12/2020

## 2020-11-13 MED ORDER — ONDANSETRON 4 MG PO TBDP: 4.0000 mg | ORAL_TABLET | Freq: Three times a day (TID) | ORAL | 0 refills | Status: AC | PRN

## 2020-11-13 NOTE — Addendum Note (Signed)
Addended by: Waldon Merl on: 11/13/2020 10:33 AM   Modules accepted: Orders

## 2020-11-23 DIAGNOSIS — Z79899 Other long term (current) drug therapy: Secondary | ICD-10-CM | POA: Diagnosis not present

## 2020-11-23 DIAGNOSIS — R69 Illness, unspecified: Secondary | ICD-10-CM | POA: Diagnosis not present

## 2020-11-23 DIAGNOSIS — Z8659 Personal history of other mental and behavioral disorders: Secondary | ICD-10-CM | POA: Diagnosis not present

## 2020-11-23 DIAGNOSIS — Z90710 Acquired absence of both cervix and uterus: Secondary | ICD-10-CM | POA: Diagnosis not present

## 2020-11-23 DIAGNOSIS — R102 Pelvic and perineal pain: Secondary | ICD-10-CM | POA: Diagnosis not present

## 2020-11-25 DIAGNOSIS — R69 Illness, unspecified: Secondary | ICD-10-CM | POA: Diagnosis not present

## 2020-11-25 DIAGNOSIS — F4310 Post-traumatic stress disorder, unspecified: Secondary | ICD-10-CM | POA: Diagnosis not present

## 2020-11-25 DIAGNOSIS — F4010 Social phobia, unspecified: Secondary | ICD-10-CM | POA: Diagnosis not present

## 2020-12-16 DIAGNOSIS — R110 Nausea: Secondary | ICD-10-CM | POA: Diagnosis not present

## 2020-12-16 DIAGNOSIS — H938X2 Other specified disorders of left ear: Secondary | ICD-10-CM | POA: Diagnosis not present

## 2020-12-17 DIAGNOSIS — B029 Zoster without complications: Secondary | ICD-10-CM | POA: Diagnosis not present

## 2020-12-17 DIAGNOSIS — M26629 Arthralgia of temporomandibular joint, unspecified side: Secondary | ICD-10-CM | POA: Diagnosis not present

## 2020-12-17 DIAGNOSIS — H938X2 Other specified disorders of left ear: Secondary | ICD-10-CM | POA: Diagnosis not present

## 2020-12-21 DIAGNOSIS — M50122 Cervical disc disorder at C5-C6 level with radiculopathy: Secondary | ICD-10-CM | POA: Diagnosis not present

## 2020-12-21 DIAGNOSIS — M9903 Segmental and somatic dysfunction of lumbar region: Secondary | ICD-10-CM | POA: Diagnosis not present

## 2020-12-21 DIAGNOSIS — M9901 Segmental and somatic dysfunction of cervical region: Secondary | ICD-10-CM | POA: Diagnosis not present

## 2020-12-21 DIAGNOSIS — M5386 Other specified dorsopathies, lumbar region: Secondary | ICD-10-CM | POA: Diagnosis not present

## 2020-12-23 DIAGNOSIS — R69 Illness, unspecified: Secondary | ICD-10-CM | POA: Diagnosis not present

## 2020-12-23 DIAGNOSIS — F4310 Post-traumatic stress disorder, unspecified: Secondary | ICD-10-CM | POA: Diagnosis not present

## 2020-12-23 DIAGNOSIS — F908 Attention-deficit hyperactivity disorder, other type: Secondary | ICD-10-CM | POA: Diagnosis not present

## 2020-12-24 DIAGNOSIS — Z5181 Encounter for therapeutic drug level monitoring: Secondary | ICD-10-CM | POA: Diagnosis not present

## 2020-12-24 DIAGNOSIS — L700 Acne vulgaris: Secondary | ICD-10-CM | POA: Diagnosis not present

## 2020-12-24 DIAGNOSIS — L705 Acne excoriee des jeunes filles: Secondary | ICD-10-CM | POA: Diagnosis not present

## 2020-12-29 DIAGNOSIS — M792 Neuralgia and neuritis, unspecified: Secondary | ICD-10-CM | POA: Diagnosis not present

## 2021-01-04 DIAGNOSIS — Z1329 Encounter for screening for other suspected endocrine disorder: Secondary | ICD-10-CM | POA: Diagnosis not present

## 2021-01-04 DIAGNOSIS — Z13220 Encounter for screening for lipoid disorders: Secondary | ICD-10-CM | POA: Diagnosis not present

## 2021-01-04 DIAGNOSIS — Z87891 Personal history of nicotine dependence: Secondary | ICD-10-CM | POA: Diagnosis not present

## 2021-01-04 DIAGNOSIS — Z0000 Encounter for general adult medical examination without abnormal findings: Secondary | ICD-10-CM | POA: Diagnosis not present

## 2021-01-13 DIAGNOSIS — R69 Illness, unspecified: Secondary | ICD-10-CM | POA: Diagnosis not present

## 2021-01-15 DIAGNOSIS — J019 Acute sinusitis, unspecified: Secondary | ICD-10-CM | POA: Diagnosis not present

## 2021-01-26 DIAGNOSIS — F4310 Post-traumatic stress disorder, unspecified: Secondary | ICD-10-CM | POA: Diagnosis not present

## 2021-01-26 DIAGNOSIS — F4010 Social phobia, unspecified: Secondary | ICD-10-CM | POA: Diagnosis not present

## 2021-01-26 DIAGNOSIS — R69 Illness, unspecified: Secondary | ICD-10-CM | POA: Diagnosis not present

## 2021-05-25 ENCOUNTER — Ambulatory Visit: Payer: No Typology Code available for payment source

## 2021-05-25 ENCOUNTER — Ambulatory Visit
Admission: RE | Admit: 2021-05-25 | Discharge: 2021-05-25 | Disposition: A | Discharge status: Home / Self Care | Payer: No Typology Code available for payment source | Source: Ambulatory Visit

## 2021-05-25 VITALS — BP 128/86 | HR 63 | Temp 97.8°F | Resp 18

## 2021-05-25 DIAGNOSIS — M79644 Pain in right finger(s): Secondary | ICD-10-CM

## 2021-05-25 DIAGNOSIS — L03011 Cellulitis of right finger: Secondary | ICD-10-CM

## 2021-05-25 MED ORDER — SULFAMETHOXAZOLE-TRIMETHOPRIM 800-160 MG PO TABS: 1.0000 | ORAL_TABLET | Freq: Two times a day (BID) | ORAL | 0 refills | Status: AC

## 2021-05-25 NOTE — Discharge Instructions (Addendum)
You have an infection of your finger that is to be treated with antibiotics.  Please also use warm Epson salt soaks.

## 2021-05-25 NOTE — ED Triage Notes (Signed)
Approx 1 week h/o infected right 2nd digit. Notes pain with bending. Has been using neosporin and peroxide with some temporary relief.

## 2021-05-25 NOTE — ED Provider Notes (Signed)
[name]    CSN: [id number] Arrival date & time: 05/25/21  0845      History   Chief Complaint Chief Complaint  Patient presents with   appointment @9a    Hand Pain    Right 2nd digit    HPI Christina Skinner is a 25 y.o. adult.   Patient presents with right second digit finger pain, redness, swelling that has been present for approximately 1 week.  Denies any known injury to finger.  Has been using Neosporin and peroxide with minimal improvement.  Denies any fevers.  Denies any numbness or tingling of digit.   Hand Pain   Past Medical History:  Diagnosis Date   Anemia    Anxiety    Bipolar depression (HCC)    Borderline personality disorder (HCC)    Depression    Endometriosis    Family history of breast cancer    Family history of ovarian cancer    GERD (gastroesophageal reflux disease)    H/O: substance abuse (HCC)    Heart murmur    IBS (irritable bowel syndrome)    Panic attack    PTSD (post-traumatic stress disorder)    Seasonal allergies    Seizures (HCC)    Temporal lobe epilepsy [facility])     Patient Active Problem List   Diagnosis Date Noted   Iron deficiency anemia due to chronic blood loss 12/18/2015   Fatigue 12/18/2015   Pica in adults 12/18/2015    Past Surgical History:  Procedure Laterality Date   ABDOMINAL HYSTERECTOMY     EYE SURGERY     KNEE SURGERY Bilateral    LAPAROSCOPIC ABDOMINAL EXPLORATION     LIVER BIOPSY     OVARIAN CYST REMOVAL     TONSILLECTOMY     TUBAL LIGATION     WISDOM TOOTH EXTRACTION      OB History   No obstetric history on file.      Home Medications    Prior to Admission medications   Medication Sig Start Date End Date Taking? Authorizing Provider  sulfamethoxazole-trimethoprim (BACTRIM DS) 800-160 MG tablet Take 1 tablet by mouth 2 (two) times daily for 7 days. 05/25/21 06/01/21 Yes 06/03/21, FNP  testosterone cypionate (DEPOTESTOTERONE CYPIONATE) 100 MG/ML injection Inject into the  muscle. 03/03/21 06/01/21 Yes [provider]  azithromycin (ZITHROMAX) 250 MG tablet Take 2 tablets on day 1. Then take 1 tablet daily. 11/12/20   11/14/20, PA-C  diclofenac (VOLTAREN) 75 MG EC tablet Take 75 mg by mouth 2 (two) times daily. 10/04/19   [provider]  fluticasone (FLONASE) 50 MCG/ACT nasal spray Place into both nostrils daily.    [provider]  gabapentin (NEURONTIN) 300 MG capsule Take 300 mg by mouth 3 (three) times daily.    [provider]  lamoTRIgine (LAMICTAL) 200 MG tablet Take 1 tablet by mouth daily. 03/08/17   [provider]  levETIRAcetam (KEPPRA) 250 MG tablet Take 250 mg by mouth 2 (two) times daily. 12/18/19   [provider]  methylphenidate (RITALIN) 10 MG tablet Take 10 mg by mouth daily. 12/10/19   [provider]  ondansetron (ZOFRAN ODT) 4 MG disintegrating tablet Take 1 tablet (4 mg total) by mouth every 8 (eight) hours as needed for nausea or vomiting. 11/13/20   11/15/20, PA-C  RETIN-A 0.025 % cream APPLY A PEA SIZED AMOUNT AS A THIN LAYER TO ENTIRE FACE NIGHTLY 09/09/19   [provider]  sertraline (ZOLOFT)  100 MG tablet Take 2 tablets by mouth daily. 08/08/18   [provider]    Family History Family History  Problem Relation Age of Onset   Bipolar disorder Mother    Breast cancer Mother    Hypertension Mother    Mental illness Mother    Rheum arthritis Mother    Seizures Mother    Diabetes Maternal Grandmother    Heart attack Maternal Grandmother    Heart disease Maternal Grandmother    Hypertension Maternal Grandmother    Hyperlipidemia Maternal Grandmother    Breast cancer Paternal Aunt    Breast cancer Paternal Aunt    Ovarian cancer Maternal Aunt    Breast cancer Maternal Aunt    Diabetes Maternal Aunt     Social History Social History   Tobacco Use   Smoking status: Former   Smokeless tobacco: Never  Building services engineer Use: Never  used  Substance Use Topics   Alcohol use: No   Drug use: Not Currently     Allergies   Ketorolac, Tape, Amoxicillin, Doxycycline, Keflex [cephalexin], Other, Sprintec 28  [norgestimate-eth estradiol], Tylenol with codeine #3 [acetaminophen-codeine], Phenergan [promethazine hcl], and Tramadol   Review of Systems Review of Systems Per HPI  Physical Exam Triage Vital Signs ED Triage Vitals  Enc Vitals Group     BP 05/25/21 0935 128/86     Pulse Rate 05/25/21 0935 63     Resp 05/25/21 0935 18     Temp 05/25/21 0935 97.8 F (36.6 C)     Temp Source 05/25/21 0935 Oral     SpO2 05/25/21 0935 98 %     Weight --      Height --      Head Circumference --      Peak Flow --      Pain Score 05/25/21 0937 4     Pain Loc --      Pain Edu? --      Excl. in GC? --    No data found.  Updated Vital Signs BP 128/86 (BP Location: Left Arm)   Pulse 63   Temp 97.8 F (36.6 C) (Oral)   Resp 18   LMP 09/27/2015   SpO2 98%   Visual Acuity Right Eye Distance:   Left Eye Distance:   Bilateral Distance:    Right Eye Near:   Left Eye Near:    Bilateral Near:     Physical Exam Constitutional:      Appearance: Normal appearance.  HENT:     Head: Normocephalic and atraumatic.  Eyes:     Extraocular Movements: Extraocular movements intact.     Conjunctiva/sclera: Conjunctivae normal.  Pulmonary:     Effort: Pulmonary effort is normal.  Skin:    General: Skin is warm and dry.     Comments: Mild erythema and swelling noted surrounding nailbed of right second digit.  Neurovascular intact.  Neurological:     General: No focal deficit present.     Mental Status: He is alert and oriented to person, place, and time. Mental status is at baseline.  Psychiatric:        Mood and Affect: Mood normal.        Behavior: Behavior normal.        Thought Content: Thought content normal.        Judgment: Judgment normal.     UC Treatments / Results  Labs (all labs ordered are listed, but  only abnormal results are displayed) Labs  Reviewed - No data to display  EKG   Radiology No results found.  Procedures Procedures (including critical care time)  Medications Ordered in UC Medications - No data to display  Initial Impression / Assessment and Plan / UC Course  I have reviewed the triage vital signs and the nursing notes.  Pertinent labs & imaging results that were available during my care of the patient were reviewed by me and considered in my medical decision making (see chart for details).     Will treat paronychia with Bactrim due to cephalexin and doxycycline allergy.  Advised patient to use warm Epson salt soaks as well.  Patient to follow-up if no improvement in symptoms. No need for I&D at this time.  Discussed strict return precautions. Patient verbalized understanding and is agreeable with plan.  Final Clinical Impressions(s) / UC Diagnoses   Final diagnoses:  Paronychia of finger of right hand  Finger pain, right     Discharge Instructions      You have an infection of your finger that is to be treated with antibiotics.  Please also use warm Epson salt soaks.     ED Prescriptions     Medication Sig Dispense Auth. Provider   sulfamethoxazole-trimethoprim (BACTRIM DS) 800-160 MG tablet Take 1 tablet by mouth 2 (two) times daily for 7 days. 14 tablet Lance Muss, FNP      PDMP not reviewed this encounter.   Lance Muss, FNP 05/25/21 1021
